# Patient Record
Sex: Female | Born: 1955
Health system: Southern US, Community
[De-identification: ages and names within clinical notes are randomized; demographics above are authoritative.]

## PROBLEM LIST (undated history)

## (undated) DIAGNOSIS — M722 Plantar fascial fibromatosis: Secondary | ICD-10-CM

## (undated) DIAGNOSIS — M1990 Unspecified osteoarthritis, unspecified site: Secondary | ICD-10-CM

## (undated) DIAGNOSIS — K219 Gastro-esophageal reflux disease without esophagitis: Secondary | ICD-10-CM

## (undated) DIAGNOSIS — F320 Major depressive disorder, single episode, mild: Secondary | ICD-10-CM

## (undated) DIAGNOSIS — F32A Depression, unspecified: Secondary | ICD-10-CM

## (undated) DIAGNOSIS — M779 Enthesopathy, unspecified: Secondary | ICD-10-CM

## (undated) DIAGNOSIS — I10 Essential (primary) hypertension: Secondary | ICD-10-CM

## (undated) DIAGNOSIS — N840 Polyp of corpus uteri: Secondary | ICD-10-CM

## (undated) DIAGNOSIS — E039 Hypothyroidism, unspecified: Secondary | ICD-10-CM

## (undated) HISTORY — DX: Essential (primary) hypertension: I10

## (undated) HISTORY — DX: Hypothyroidism, unspecified: E03.9

## (undated) HISTORY — PX: ENDOMETRIAL BIOPSY: SHX622

## (undated) HISTORY — DX: Polyp of corpus uteri: N84.0

## (undated) HISTORY — DX: Plantar fascial fibromatosis: M72.2

## (undated) HISTORY — DX: Depression, unspecified: F32.A

## (undated) HISTORY — DX: Enthesopathy, unspecified: M77.9

## (undated) HISTORY — DX: Unspecified osteoarthritis, unspecified site: M19.90

## (undated) HISTORY — PX: HYSTEROSCOPY: SHX211

## (undated) HISTORY — PX: WISDOM TOOTH EXTRACTION: SHX21

## (undated) HISTORY — PX: TUBAL LIGATION: SHX77

## (undated) HISTORY — DX: Major depressive disorder, single episode, mild: F32.0

---

## 2005-06-24 ENCOUNTER — Ambulatory Visit: Payer: Self-pay | Admitting: Obstetrics and Gynecology

## 2006-10-14 ENCOUNTER — Ambulatory Visit: Payer: Self-pay | Admitting: Allergy and Immunology

## 2006-11-18 ENCOUNTER — Other Ambulatory Visit: Payer: Self-pay

## 2006-11-18 ENCOUNTER — Ambulatory Visit: Payer: Self-pay | Admitting: Unknown Physician Specialty

## 2006-11-26 ENCOUNTER — Ambulatory Visit: Payer: Self-pay | Admitting: Unknown Physician Specialty

## 2007-03-31 ENCOUNTER — Ambulatory Visit: Payer: Self-pay | Admitting: Unknown Physician Specialty

## 2007-04-28 ENCOUNTER — Ambulatory Visit: Payer: Self-pay | Admitting: Unknown Physician Specialty

## 2008-05-11 ENCOUNTER — Ambulatory Visit: Payer: Self-pay | Admitting: Unknown Physician Specialty

## 2010-04-23 ENCOUNTER — Ambulatory Visit: Payer: Self-pay | Admitting: Unknown Physician Specialty

## 2010-06-05 ENCOUNTER — Ambulatory Visit: Payer: Self-pay | Admitting: Unknown Physician Specialty

## 2010-10-14 ENCOUNTER — Encounter: Payer: Self-pay | Admitting: Surgery

## 2010-10-23 ENCOUNTER — Encounter: Payer: Self-pay | Admitting: Surgery

## 2010-11-21 ENCOUNTER — Encounter: Payer: Self-pay | Admitting: Surgery

## 2011-08-06 ENCOUNTER — Ambulatory Visit: Payer: Self-pay | Admitting: Unknown Physician Specialty

## 2011-08-27 ENCOUNTER — Ambulatory Visit: Payer: Self-pay | Admitting: Unknown Physician Specialty

## 2012-10-13 ENCOUNTER — Ambulatory Visit: Payer: Self-pay | Admitting: General Surgery

## 2012-11-16 ENCOUNTER — Ambulatory Visit: Payer: Self-pay | Admitting: Gastroenterology

## 2013-01-17 ENCOUNTER — Ambulatory Visit: Payer: Self-pay | Admitting: Family

## 2013-01-20 ENCOUNTER — Ambulatory Visit: Payer: Self-pay | Admitting: Family

## 2013-02-20 ENCOUNTER — Ambulatory Visit: Payer: Self-pay | Admitting: Family

## 2013-03-22 ENCOUNTER — Ambulatory Visit: Payer: Self-pay | Admitting: Family

## 2013-04-22 ENCOUNTER — Ambulatory Visit: Payer: Self-pay | Admitting: Family

## 2014-04-11 ENCOUNTER — Ambulatory Visit (INDEPENDENT_AMBULATORY_CARE_PROVIDER_SITE_OTHER): Payer: BC Managed Care – PPO

## 2014-04-11 ENCOUNTER — Ambulatory Visit (INDEPENDENT_AMBULATORY_CARE_PROVIDER_SITE_OTHER): Payer: BC Managed Care – PPO | Admitting: Podiatry

## 2014-04-11 ENCOUNTER — Encounter: Payer: Self-pay | Admitting: Podiatry

## 2014-04-11 VITALS — Resp 16 | Ht 66.0 in | Wt 260.0 lb

## 2014-04-11 DIAGNOSIS — M779 Enthesopathy, unspecified: Secondary | ICD-10-CM

## 2014-04-11 MED ORDER — TRIAMCINOLONE ACETONIDE 10 MG/ML IJ SUSP
10.0000 mg | Freq: Once | INTRAMUSCULAR | Status: AC
  Administered 2014-04-11: 10 mg

## 2014-04-13 NOTE — Progress Notes (Signed)
Subjective:     Patient ID: Brittany Rocha, female   DOB: 04/01/1956, 58 y.o.   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   Review of Systems     Objective:   Physical Exam Neurovascular status intact with no other changes in health history and significant discomfort in the sinus tarsi of both feet with inflammation and fluid noted around the sinus tarsi    Assessment:     Chronic capsulitis chronic sinus tarsitis secondary to severe foot structure with reduction of arch height noted    Plan:     Reviewed condition and did sinus tarsi injections bilateral 3 mg Kenalog 5 of vesica Marcaine mixture and scanned for any type of orthotic but she can wear at different types of shoes and she's not able to wear the heel once appropriately when I need her to wear at them. Reappoint when they are ready

## 2014-04-24 ENCOUNTER — Encounter: Payer: Self-pay | Admitting: *Deleted

## 2014-04-24 ENCOUNTER — Telehealth: Payer: Self-pay | Admitting: *Deleted

## 2014-04-24 NOTE — Telephone Encounter (Signed)
Left message for pt to call and schedule appt to pick up orthotics.

## 2014-04-24 NOTE — Progress Notes (Signed)
Sent pt post card letting her know orthotics are here. 

## 2014-04-28 ENCOUNTER — Ambulatory Visit (INDEPENDENT_AMBULATORY_CARE_PROVIDER_SITE_OTHER): Payer: BC Managed Care – PPO | Admitting: *Deleted

## 2014-04-28 VITALS — BP 119/75 | HR 83 | Resp 16

## 2014-04-28 DIAGNOSIS — M779 Enthesopathy, unspecified: Secondary | ICD-10-CM

## 2014-04-28 NOTE — Patient Instructions (Signed)

## 2014-04-28 NOTE — Progress Notes (Signed)
Pt presents to pick up orthotics and went over wearing instructions with pt. 

## 2016-08-08 ENCOUNTER — Other Ambulatory Visit: Payer: Self-pay | Admitting: Obstetrics & Gynecology

## 2016-08-08 DIAGNOSIS — Z1231 Encounter for screening mammogram for malignant neoplasm of breast: Secondary | ICD-10-CM

## 2016-08-22 LAB — HM PAP SMEAR: HM PAP: NEGATIVE

## 2016-09-18 ENCOUNTER — Encounter: Payer: Self-pay | Admitting: Radiology

## 2016-09-18 ENCOUNTER — Ambulatory Visit
Admission: RE | Admit: 2016-09-18 | Discharge: 2016-09-18 | Disposition: A | Discharge status: Home / Self Care | Payer: BLUE CROSS/BLUE SHIELD | Source: Ambulatory Visit | Attending: Obstetrics & Gynecology | Admitting: Obstetrics & Gynecology

## 2016-09-18 DIAGNOSIS — Z1231 Encounter for screening mammogram for malignant neoplasm of breast: Secondary | ICD-10-CM | POA: Diagnosis present

## 2016-09-18 LAB — HM MAMMOGRAPHY

## 2016-09-19 ENCOUNTER — Ambulatory Visit: Payer: Self-pay

## 2016-09-29 ENCOUNTER — Other Ambulatory Visit: Payer: Self-pay | Admitting: *Deleted

## 2016-09-29 ENCOUNTER — Inpatient Hospital Stay
Admission: RE | Admit: 2016-09-29 | Discharge: 2016-09-29 | Disposition: A | Discharge status: Home / Self Care | Payer: Self-pay | Source: Ambulatory Visit | Attending: *Deleted | Admitting: *Deleted

## 2016-09-29 DIAGNOSIS — Z9289 Personal history of other medical treatment: Secondary | ICD-10-CM

## 2016-10-24 DIAGNOSIS — R7303 Prediabetes: Secondary | ICD-10-CM | POA: Insufficient documentation

## 2016-10-24 DIAGNOSIS — I10 Essential (primary) hypertension: Secondary | ICD-10-CM | POA: Diagnosis not present

## 2016-10-24 DIAGNOSIS — Z0000 Encounter for general adult medical examination without abnormal findings: Secondary | ICD-10-CM | POA: Diagnosis not present

## 2016-10-24 DIAGNOSIS — E039 Hypothyroidism, unspecified: Secondary | ICD-10-CM | POA: Diagnosis not present

## 2016-11-25 DIAGNOSIS — J3081 Allergic rhinitis due to animal (cat) (dog) hair and dander: Secondary | ICD-10-CM | POA: Diagnosis not present

## 2016-11-25 DIAGNOSIS — J3089 Other allergic rhinitis: Secondary | ICD-10-CM | POA: Diagnosis not present

## 2016-11-25 DIAGNOSIS — J301 Allergic rhinitis due to pollen: Secondary | ICD-10-CM | POA: Diagnosis not present

## 2016-11-27 DIAGNOSIS — J301 Allergic rhinitis due to pollen: Secondary | ICD-10-CM | POA: Diagnosis not present

## 2016-11-27 DIAGNOSIS — J3089 Other allergic rhinitis: Secondary | ICD-10-CM | POA: Diagnosis not present

## 2016-11-27 DIAGNOSIS — J3081 Allergic rhinitis due to animal (cat) (dog) hair and dander: Secondary | ICD-10-CM | POA: Diagnosis not present

## 2016-12-02 DIAGNOSIS — J3089 Other allergic rhinitis: Secondary | ICD-10-CM | POA: Diagnosis not present

## 2016-12-02 DIAGNOSIS — J301 Allergic rhinitis due to pollen: Secondary | ICD-10-CM | POA: Diagnosis not present

## 2016-12-02 DIAGNOSIS — J3081 Allergic rhinitis due to animal (cat) (dog) hair and dander: Secondary | ICD-10-CM | POA: Diagnosis not present

## 2016-12-11 DIAGNOSIS — J3089 Other allergic rhinitis: Secondary | ICD-10-CM | POA: Diagnosis not present

## 2016-12-11 DIAGNOSIS — J3081 Allergic rhinitis due to animal (cat) (dog) hair and dander: Secondary | ICD-10-CM | POA: Diagnosis not present

## 2016-12-11 DIAGNOSIS — J301 Allergic rhinitis due to pollen: Secondary | ICD-10-CM | POA: Diagnosis not present

## 2016-12-25 DIAGNOSIS — J301 Allergic rhinitis due to pollen: Secondary | ICD-10-CM | POA: Diagnosis not present

## 2016-12-25 DIAGNOSIS — J3081 Allergic rhinitis due to animal (cat) (dog) hair and dander: Secondary | ICD-10-CM | POA: Diagnosis not present

## 2016-12-25 DIAGNOSIS — J3089 Other allergic rhinitis: Secondary | ICD-10-CM | POA: Diagnosis not present

## 2016-12-30 DIAGNOSIS — J301 Allergic rhinitis due to pollen: Secondary | ICD-10-CM | POA: Diagnosis not present

## 2016-12-30 DIAGNOSIS — J3081 Allergic rhinitis due to animal (cat) (dog) hair and dander: Secondary | ICD-10-CM | POA: Diagnosis not present

## 2016-12-30 DIAGNOSIS — J3089 Other allergic rhinitis: Secondary | ICD-10-CM | POA: Diagnosis not present

## 2017-01-06 DIAGNOSIS — J3089 Other allergic rhinitis: Secondary | ICD-10-CM | POA: Diagnosis not present

## 2017-01-06 DIAGNOSIS — J3081 Allergic rhinitis due to animal (cat) (dog) hair and dander: Secondary | ICD-10-CM | POA: Diagnosis not present

## 2017-01-06 DIAGNOSIS — J301 Allergic rhinitis due to pollen: Secondary | ICD-10-CM | POA: Diagnosis not present

## 2017-01-16 DIAGNOSIS — J301 Allergic rhinitis due to pollen: Secondary | ICD-10-CM | POA: Diagnosis not present

## 2017-01-16 DIAGNOSIS — J3081 Allergic rhinitis due to animal (cat) (dog) hair and dander: Secondary | ICD-10-CM | POA: Diagnosis not present

## 2017-01-16 DIAGNOSIS — J3089 Other allergic rhinitis: Secondary | ICD-10-CM | POA: Diagnosis not present

## 2017-01-23 DIAGNOSIS — J3089 Other allergic rhinitis: Secondary | ICD-10-CM | POA: Diagnosis not present

## 2017-01-23 DIAGNOSIS — J301 Allergic rhinitis due to pollen: Secondary | ICD-10-CM | POA: Diagnosis not present

## 2017-01-29 DIAGNOSIS — R7303 Prediabetes: Secondary | ICD-10-CM | POA: Diagnosis not present

## 2017-01-29 DIAGNOSIS — I10 Essential (primary) hypertension: Secondary | ICD-10-CM | POA: Diagnosis not present

## 2017-01-29 DIAGNOSIS — J019 Acute sinusitis, unspecified: Secondary | ICD-10-CM | POA: Diagnosis not present

## 2017-02-17 DIAGNOSIS — R7303 Prediabetes: Secondary | ICD-10-CM | POA: Diagnosis not present

## 2017-02-17 DIAGNOSIS — I10 Essential (primary) hypertension: Secondary | ICD-10-CM | POA: Diagnosis not present

## 2017-02-17 DIAGNOSIS — E039 Hypothyroidism, unspecified: Secondary | ICD-10-CM | POA: Diagnosis not present

## 2017-02-23 DIAGNOSIS — R7303 Prediabetes: Secondary | ICD-10-CM | POA: Diagnosis not present

## 2017-02-23 DIAGNOSIS — I10 Essential (primary) hypertension: Secondary | ICD-10-CM | POA: Diagnosis not present

## 2017-02-23 DIAGNOSIS — E039 Hypothyroidism, unspecified: Secondary | ICD-10-CM | POA: Diagnosis not present

## 2017-03-03 DIAGNOSIS — J3089 Other allergic rhinitis: Secondary | ICD-10-CM | POA: Diagnosis not present

## 2017-03-03 DIAGNOSIS — J301 Allergic rhinitis due to pollen: Secondary | ICD-10-CM | POA: Diagnosis not present

## 2017-03-03 DIAGNOSIS — J3081 Allergic rhinitis due to animal (cat) (dog) hair and dander: Secondary | ICD-10-CM | POA: Diagnosis not present

## 2017-03-04 DIAGNOSIS — H524 Presbyopia: Secondary | ICD-10-CM | POA: Diagnosis not present

## 2017-03-13 DIAGNOSIS — J301 Allergic rhinitis due to pollen: Secondary | ICD-10-CM | POA: Diagnosis not present

## 2017-03-13 DIAGNOSIS — J3089 Other allergic rhinitis: Secondary | ICD-10-CM | POA: Diagnosis not present

## 2017-03-13 DIAGNOSIS — J3081 Allergic rhinitis due to animal (cat) (dog) hair and dander: Secondary | ICD-10-CM | POA: Diagnosis not present

## 2017-03-17 DIAGNOSIS — H539 Unspecified visual disturbance: Secondary | ICD-10-CM | POA: Diagnosis not present

## 2017-03-17 DIAGNOSIS — R42 Dizziness and giddiness: Secondary | ICD-10-CM | POA: Diagnosis not present

## 2017-03-18 ENCOUNTER — Other Ambulatory Visit: Payer: Self-pay | Admitting: Family Medicine

## 2017-03-18 DIAGNOSIS — H539 Unspecified visual disturbance: Secondary | ICD-10-CM

## 2017-03-18 DIAGNOSIS — R42 Dizziness and giddiness: Secondary | ICD-10-CM

## 2017-03-19 DIAGNOSIS — J3089 Other allergic rhinitis: Secondary | ICD-10-CM | POA: Diagnosis not present

## 2017-03-19 DIAGNOSIS — J3081 Allergic rhinitis due to animal (cat) (dog) hair and dander: Secondary | ICD-10-CM | POA: Diagnosis not present

## 2017-03-19 DIAGNOSIS — J301 Allergic rhinitis due to pollen: Secondary | ICD-10-CM | POA: Diagnosis not present

## 2017-03-24 ENCOUNTER — Ambulatory Visit
Admission: RE | Admit: 2017-03-24 | Discharge: 2017-03-24 | Disposition: A | Discharge status: Home / Self Care | Payer: BLUE CROSS/BLUE SHIELD | Source: Ambulatory Visit | Attending: Family Medicine | Admitting: Family Medicine

## 2017-03-24 DIAGNOSIS — R42 Dizziness and giddiness: Secondary | ICD-10-CM

## 2017-03-24 DIAGNOSIS — H539 Unspecified visual disturbance: Secondary | ICD-10-CM | POA: Diagnosis not present

## 2017-03-31 DIAGNOSIS — J3089 Other allergic rhinitis: Secondary | ICD-10-CM | POA: Diagnosis not present

## 2017-03-31 DIAGNOSIS — J301 Allergic rhinitis due to pollen: Secondary | ICD-10-CM | POA: Diagnosis not present

## 2017-03-31 DIAGNOSIS — J3081 Allergic rhinitis due to animal (cat) (dog) hair and dander: Secondary | ICD-10-CM | POA: Diagnosis not present

## 2017-04-07 DIAGNOSIS — J3089 Other allergic rhinitis: Secondary | ICD-10-CM | POA: Diagnosis not present

## 2017-04-07 DIAGNOSIS — J3081 Allergic rhinitis due to animal (cat) (dog) hair and dander: Secondary | ICD-10-CM | POA: Diagnosis not present

## 2017-04-07 DIAGNOSIS — J301 Allergic rhinitis due to pollen: Secondary | ICD-10-CM | POA: Diagnosis not present

## 2017-04-14 DIAGNOSIS — J3081 Allergic rhinitis due to animal (cat) (dog) hair and dander: Secondary | ICD-10-CM | POA: Diagnosis not present

## 2017-04-14 DIAGNOSIS — J3089 Other allergic rhinitis: Secondary | ICD-10-CM | POA: Diagnosis not present

## 2017-04-14 DIAGNOSIS — J301 Allergic rhinitis due to pollen: Secondary | ICD-10-CM | POA: Diagnosis not present

## 2017-04-24 DIAGNOSIS — J301 Allergic rhinitis due to pollen: Secondary | ICD-10-CM | POA: Diagnosis not present

## 2017-04-24 DIAGNOSIS — J3089 Other allergic rhinitis: Secondary | ICD-10-CM | POA: Diagnosis not present

## 2017-04-24 DIAGNOSIS — J3081 Allergic rhinitis due to animal (cat) (dog) hair and dander: Secondary | ICD-10-CM | POA: Diagnosis not present

## 2017-05-01 DIAGNOSIS — J3089 Other allergic rhinitis: Secondary | ICD-10-CM | POA: Diagnosis not present

## 2017-05-01 DIAGNOSIS — J3081 Allergic rhinitis due to animal (cat) (dog) hair and dander: Secondary | ICD-10-CM | POA: Diagnosis not present

## 2017-05-01 DIAGNOSIS — J301 Allergic rhinitis due to pollen: Secondary | ICD-10-CM | POA: Diagnosis not present

## 2017-05-05 ENCOUNTER — Ambulatory Visit (INDEPENDENT_AMBULATORY_CARE_PROVIDER_SITE_OTHER): Payer: BLUE CROSS/BLUE SHIELD

## 2017-05-05 ENCOUNTER — Ambulatory Visit (INDEPENDENT_AMBULATORY_CARE_PROVIDER_SITE_OTHER): Payer: BLUE CROSS/BLUE SHIELD | Admitting: Podiatry

## 2017-05-05 DIAGNOSIS — M659 Synovitis and tenosynovitis, unspecified: Secondary | ICD-10-CM | POA: Diagnosis not present

## 2017-05-05 DIAGNOSIS — E039 Hypothyroidism, unspecified: Secondary | ICD-10-CM | POA: Insufficient documentation

## 2017-05-05 DIAGNOSIS — M79673 Pain in unspecified foot: Secondary | ICD-10-CM | POA: Diagnosis not present

## 2017-05-05 DIAGNOSIS — M25572 Pain in left ankle and joints of left foot: Secondary | ICD-10-CM | POA: Diagnosis not present

## 2017-05-05 DIAGNOSIS — M25571 Pain in right ankle and joints of right foot: Secondary | ICD-10-CM | POA: Diagnosis not present

## 2017-05-05 DIAGNOSIS — K219 Gastro-esophageal reflux disease without esophagitis: Secondary | ICD-10-CM | POA: Insufficient documentation

## 2017-05-05 DIAGNOSIS — J3089 Other allergic rhinitis: Secondary | ICD-10-CM | POA: Diagnosis not present

## 2017-05-05 DIAGNOSIS — J3081 Allergic rhinitis due to animal (cat) (dog) hair and dander: Secondary | ICD-10-CM | POA: Diagnosis not present

## 2017-05-05 DIAGNOSIS — I10 Essential (primary) hypertension: Secondary | ICD-10-CM | POA: Insufficient documentation

## 2017-05-05 DIAGNOSIS — J301 Allergic rhinitis due to pollen: Secondary | ICD-10-CM | POA: Diagnosis not present

## 2017-05-05 DIAGNOSIS — M7751 Other enthesopathy of right foot: Secondary | ICD-10-CM

## 2017-05-07 DIAGNOSIS — J3089 Other allergic rhinitis: Secondary | ICD-10-CM | POA: Diagnosis not present

## 2017-05-07 DIAGNOSIS — J301 Allergic rhinitis due to pollen: Secondary | ICD-10-CM | POA: Diagnosis not present

## 2017-05-07 DIAGNOSIS — J3081 Allergic rhinitis due to animal (cat) (dog) hair and dander: Secondary | ICD-10-CM | POA: Diagnosis not present

## 2017-05-11 MED ORDER — BETAMETHASONE SOD PHOS & ACET 6 (3-3) MG/ML IJ SUSP: 3.0000 mg | Freq: Once | INTRAMUSCULAR | Status: AC

## 2017-05-11 NOTE — Progress Notes (Signed)
   Subjective:  Patient presents today for pain and tenderness to the bilateral ankles. Patient relates significant pain and tenderness when walking.  Patient states that the pain is on the lateral aspects of her ankles bilaterally for the past 3 months. Gradual onset. Patient denies any trauma. Patient also states that she has right big toe joint pain. She presents today for further treatment and evaluation   Objective / Physical Exam:  General:  The patient is alert and oriented x3 in no acute distress. Dermatology:  Skin is warm, dry and supple bilateral lower extremities. Negative for open lesions or macerations. Vascular:  Palpable pedal pulses bilaterally. No edema or erythema noted. Capillary refill within normal limits. Neurological:  Epicritic and protective threshold grossly intact bilaterally.  Musculoskeletal Exam:  Pain on palpation to the anterior lateral medial aspects of the patient's bilateral ankles. Mild edema noted. There is also pain on palpation with and range of motion to the first MTPJ right foot Range of motion within normal limits to all pedal and ankle joints bilateral. Muscle strength 5/5 in all groups bilateral.   Radiographic Exam:  Normal osseous mineralization. Joint spaces preserved. No fracture/dislocation/boney destruction.    Assessment: #1 pain in bilateral ankles with synovitis #2 metatarsophalangeal capsulitis first MTPJ right foot  Plan of Care:  #1 Patient was evaluated. #2 injection of 0.5 mL Celestone Soluspan injected in the patient's bilateral ankle joints. #3 injection of 0.5 mL Celestone Soluspan injected in the first MTPJ right foot #4 ankle braces were dispensed for the bilateral lower extremities to support ankle joints #5 today were going to arrange an appointment with Raiford Noble, Pedorthist for custom molded insoles #6 continue over-the-counter Motrin when necessary #7 return to clinic in 4 weeks   Felecia Shelling, DPM [facility]  Dr. Felecia Shelling, DPM    [address]                                        [place], [place] [address]                Office [phone]  Fax [phone]

## 2017-05-12 DIAGNOSIS — J3081 Allergic rhinitis due to animal (cat) (dog) hair and dander: Secondary | ICD-10-CM | POA: Diagnosis not present

## 2017-05-12 DIAGNOSIS — J301 Allergic rhinitis due to pollen: Secondary | ICD-10-CM | POA: Diagnosis not present

## 2017-05-12 DIAGNOSIS — J3089 Other allergic rhinitis: Secondary | ICD-10-CM | POA: Diagnosis not present

## 2017-05-15 DIAGNOSIS — J3081 Allergic rhinitis due to animal (cat) (dog) hair and dander: Secondary | ICD-10-CM | POA: Diagnosis not present

## 2017-05-15 DIAGNOSIS — J301 Allergic rhinitis due to pollen: Secondary | ICD-10-CM | POA: Diagnosis not present

## 2017-05-19 DIAGNOSIS — J301 Allergic rhinitis due to pollen: Secondary | ICD-10-CM | POA: Diagnosis not present

## 2017-05-19 DIAGNOSIS — J3081 Allergic rhinitis due to animal (cat) (dog) hair and dander: Secondary | ICD-10-CM | POA: Diagnosis not present

## 2017-05-19 DIAGNOSIS — J3089 Other allergic rhinitis: Secondary | ICD-10-CM | POA: Diagnosis not present

## 2017-05-20 ENCOUNTER — Ambulatory Visit (INDEPENDENT_AMBULATORY_CARE_PROVIDER_SITE_OTHER): Payer: BLUE CROSS/BLUE SHIELD | Admitting: Orthotics

## 2017-05-20 DIAGNOSIS — M659 Synovitis and tenosynovitis, unspecified: Secondary | ICD-10-CM

## 2017-05-20 NOTE — Progress Notes (Signed)
Patient was seen today for custom foot orthotics.  Patient presents wit synovitis of left ankle, and medial column collapse.  Goal arch support, take pressure off peroneals.  Plan of deep, wide orthotic wi/ medial lateral flange.

## 2017-05-21 DIAGNOSIS — J301 Allergic rhinitis due to pollen: Secondary | ICD-10-CM | POA: Diagnosis not present

## 2017-05-21 DIAGNOSIS — J3081 Allergic rhinitis due to animal (cat) (dog) hair and dander: Secondary | ICD-10-CM | POA: Diagnosis not present

## 2017-05-29 DIAGNOSIS — J3089 Other allergic rhinitis: Secondary | ICD-10-CM | POA: Diagnosis not present

## 2017-05-29 DIAGNOSIS — J3081 Allergic rhinitis due to animal (cat) (dog) hair and dander: Secondary | ICD-10-CM | POA: Diagnosis not present

## 2017-05-29 DIAGNOSIS — J301 Allergic rhinitis due to pollen: Secondary | ICD-10-CM | POA: Diagnosis not present

## 2017-06-02 ENCOUNTER — Ambulatory Visit (INDEPENDENT_AMBULATORY_CARE_PROVIDER_SITE_OTHER): Payer: BLUE CROSS/BLUE SHIELD | Admitting: Podiatry

## 2017-06-02 DIAGNOSIS — M659 Synovitis and tenosynovitis, unspecified: Secondary | ICD-10-CM

## 2017-06-02 DIAGNOSIS — J301 Allergic rhinitis due to pollen: Secondary | ICD-10-CM | POA: Diagnosis not present

## 2017-06-02 DIAGNOSIS — J3081 Allergic rhinitis due to animal (cat) (dog) hair and dander: Secondary | ICD-10-CM | POA: Diagnosis not present

## 2017-06-02 DIAGNOSIS — J3089 Other allergic rhinitis: Secondary | ICD-10-CM | POA: Diagnosis not present

## 2017-06-05 MED ORDER — BETAMETHASONE SOD PHOS & ACET 6 (3-3) MG/ML IJ SUSP: 3.0000 mg | Freq: Once | INTRAMUSCULAR | Status: AC

## 2017-06-05 NOTE — Progress Notes (Signed)
   Subjective:  Patient presents today for follow-up evaluation of pain and tenderness to the bilateral ankles and right foot. She states the pain has improved but she is still experiencing mild pain on the lateral side of the right ankle and the left foot. She states wearing the ankle braces help to alleviate the pain but they are too big.  She also presents with a new complaint of intermittent pain of the right great toe that began about 3 years ago. She states she had the toenail removed over three years ago and is unsure if that is the cause of the pain. She presents today for further treatment and evaluation   Objective / Physical Exam:  General:  The patient is alert and oriented x3 in no acute distress. Dermatology:  Skin is warm, dry and supple bilateral lower extremities. Negative for open lesions or macerations. Vascular:  Palpable pedal pulses bilaterally. No edema or erythema noted. Capillary refill within normal limits. Neurological:  Epicritic and protective threshold grossly intact bilaterally.  Musculoskeletal Exam:  Pain on palpation to the anterior lateral medial aspects of the patient's bilateral ankles. Mild edema noted. There is also pain on palpation with and range of motion to the first MTPJ right foot Range of motion within normal limits to all pedal and ankle joints bilateral. Muscle strength 5/5 in all groups bilateral.   Assessment: #1 pain in bilateral ankles with synovitis #2 DJD/hallux limitus first MTPJ right foot  Plan of Care:  #1 Patient was evaluated. #2 injection of 0.5 mL Celestone Soluspan injected in the patient's right ankle joint. #3 Has appt with Rick on 06/17/17 to pick up orthotics.  #4 Continue taking Motrin as needed.  #5 Return to clinic when necessary.    Felecia Shelling, DPM [facility]  Dr. Felecia Shelling, DPM    [address]                                        [place], [place] [address]                Office  [phone]  Fax [phone]

## 2017-06-12 DIAGNOSIS — J3081 Allergic rhinitis due to animal (cat) (dog) hair and dander: Secondary | ICD-10-CM | POA: Diagnosis not present

## 2017-06-12 DIAGNOSIS — J301 Allergic rhinitis due to pollen: Secondary | ICD-10-CM | POA: Diagnosis not present

## 2017-06-12 DIAGNOSIS — J4540 Moderate persistent asthma, uncomplicated: Secondary | ICD-10-CM | POA: Diagnosis not present

## 2017-06-12 DIAGNOSIS — J3089 Other allergic rhinitis: Secondary | ICD-10-CM | POA: Diagnosis not present

## 2017-06-17 ENCOUNTER — Ambulatory Visit: Payer: BLUE CROSS/BLUE SHIELD | Admitting: Orthotics

## 2017-06-17 DIAGNOSIS — M659 Synovitis and tenosynovitis, unspecified: Secondary | ICD-10-CM

## 2017-06-17 NOTE — Progress Notes (Signed)
Patient came in today to pick up custom made foot orthotics.  The goals were accomplished and the patient reported no dissatisfaction with said orthotics.  Patient was advised of breakin period and how to report any issues. 

## 2017-06-25 DIAGNOSIS — J3081 Allergic rhinitis due to animal (cat) (dog) hair and dander: Secondary | ICD-10-CM | POA: Diagnosis not present

## 2017-06-25 DIAGNOSIS — J3089 Other allergic rhinitis: Secondary | ICD-10-CM | POA: Diagnosis not present

## 2017-06-25 DIAGNOSIS — J301 Allergic rhinitis due to pollen: Secondary | ICD-10-CM | POA: Diagnosis not present

## 2017-07-06 DIAGNOSIS — R0789 Other chest pain: Secondary | ICD-10-CM | POA: Diagnosis not present

## 2017-07-10 DIAGNOSIS — J3089 Other allergic rhinitis: Secondary | ICD-10-CM | POA: Diagnosis not present

## 2017-07-10 DIAGNOSIS — J3081 Allergic rhinitis due to animal (cat) (dog) hair and dander: Secondary | ICD-10-CM | POA: Diagnosis not present

## 2017-07-10 DIAGNOSIS — J301 Allergic rhinitis due to pollen: Secondary | ICD-10-CM | POA: Diagnosis not present

## 2017-07-16 DIAGNOSIS — J301 Allergic rhinitis due to pollen: Secondary | ICD-10-CM | POA: Diagnosis not present

## 2017-07-16 DIAGNOSIS — J3089 Other allergic rhinitis: Secondary | ICD-10-CM | POA: Diagnosis not present

## 2017-07-21 DIAGNOSIS — J3081 Allergic rhinitis due to animal (cat) (dog) hair and dander: Secondary | ICD-10-CM | POA: Diagnosis not present

## 2017-07-21 DIAGNOSIS — J3089 Other allergic rhinitis: Secondary | ICD-10-CM | POA: Diagnosis not present

## 2017-07-21 DIAGNOSIS — J301 Allergic rhinitis due to pollen: Secondary | ICD-10-CM | POA: Diagnosis not present

## 2017-07-23 DIAGNOSIS — J301 Allergic rhinitis due to pollen: Secondary | ICD-10-CM | POA: Diagnosis not present

## 2017-07-23 DIAGNOSIS — J3081 Allergic rhinitis due to animal (cat) (dog) hair and dander: Secondary | ICD-10-CM | POA: Diagnosis not present

## 2017-07-24 DIAGNOSIS — J3089 Other allergic rhinitis: Secondary | ICD-10-CM | POA: Diagnosis not present

## 2017-07-30 DIAGNOSIS — J3081 Allergic rhinitis due to animal (cat) (dog) hair and dander: Secondary | ICD-10-CM | POA: Diagnosis not present

## 2017-07-30 DIAGNOSIS — J3089 Other allergic rhinitis: Secondary | ICD-10-CM | POA: Diagnosis not present

## 2017-07-30 DIAGNOSIS — J301 Allergic rhinitis due to pollen: Secondary | ICD-10-CM | POA: Diagnosis not present

## 2017-08-04 DIAGNOSIS — J3089 Other allergic rhinitis: Secondary | ICD-10-CM | POA: Diagnosis not present

## 2017-08-04 DIAGNOSIS — J3081 Allergic rhinitis due to animal (cat) (dog) hair and dander: Secondary | ICD-10-CM | POA: Diagnosis not present

## 2017-08-04 DIAGNOSIS — J301 Allergic rhinitis due to pollen: Secondary | ICD-10-CM | POA: Diagnosis not present

## 2017-08-07 DIAGNOSIS — J301 Allergic rhinitis due to pollen: Secondary | ICD-10-CM | POA: Diagnosis not present

## 2017-08-07 DIAGNOSIS — J3081 Allergic rhinitis due to animal (cat) (dog) hair and dander: Secondary | ICD-10-CM | POA: Diagnosis not present

## 2017-08-07 DIAGNOSIS — J3089 Other allergic rhinitis: Secondary | ICD-10-CM | POA: Diagnosis not present

## 2017-08-11 DIAGNOSIS — J3081 Allergic rhinitis due to animal (cat) (dog) hair and dander: Secondary | ICD-10-CM | POA: Diagnosis not present

## 2017-08-11 DIAGNOSIS — J3089 Other allergic rhinitis: Secondary | ICD-10-CM | POA: Diagnosis not present

## 2017-08-11 DIAGNOSIS — J301 Allergic rhinitis due to pollen: Secondary | ICD-10-CM | POA: Diagnosis not present

## 2017-08-17 DIAGNOSIS — E039 Hypothyroidism, unspecified: Secondary | ICD-10-CM | POA: Diagnosis not present

## 2017-08-17 DIAGNOSIS — R7303 Prediabetes: Secondary | ICD-10-CM | POA: Diagnosis not present

## 2017-08-17 DIAGNOSIS — I10 Essential (primary) hypertension: Secondary | ICD-10-CM | POA: Diagnosis not present

## 2017-08-19 ENCOUNTER — Other Ambulatory Visit: Payer: Self-pay | Admitting: Obstetrics & Gynecology

## 2017-08-19 DIAGNOSIS — Z1231 Encounter for screening mammogram for malignant neoplasm of breast: Secondary | ICD-10-CM

## 2017-08-20 DIAGNOSIS — J069 Acute upper respiratory infection, unspecified: Secondary | ICD-10-CM | POA: Diagnosis not present

## 2017-08-24 DIAGNOSIS — I10 Essential (primary) hypertension: Secondary | ICD-10-CM | POA: Diagnosis not present

## 2017-08-24 DIAGNOSIS — E039 Hypothyroidism, unspecified: Secondary | ICD-10-CM | POA: Diagnosis not present

## 2017-08-24 DIAGNOSIS — R7303 Prediabetes: Secondary | ICD-10-CM | POA: Diagnosis not present

## 2017-09-03 ENCOUNTER — Encounter: Payer: Self-pay | Admitting: Obstetrics & Gynecology

## 2017-09-11 ENCOUNTER — Encounter: Payer: Self-pay | Admitting: Obstetrics & Gynecology

## 2017-09-16 ENCOUNTER — Ambulatory Visit (INDEPENDENT_AMBULATORY_CARE_PROVIDER_SITE_OTHER): Payer: BLUE CROSS/BLUE SHIELD | Admitting: Obstetrics & Gynecology

## 2017-09-16 ENCOUNTER — Encounter: Payer: Self-pay | Admitting: Obstetrics & Gynecology

## 2017-09-16 VITALS — BP 120/80 | HR 84 | Ht 66.0 in | Wt 254.0 lb

## 2017-09-16 DIAGNOSIS — Z01419 Encounter for gynecological examination (general) (routine) without abnormal findings: Secondary | ICD-10-CM | POA: Diagnosis not present

## 2017-09-16 DIAGNOSIS — Z1211 Encounter for screening for malignant neoplasm of colon: Secondary | ICD-10-CM | POA: Diagnosis not present

## 2017-09-16 DIAGNOSIS — Z0000 Encounter for general adult medical examination without abnormal findings: Secondary | ICD-10-CM

## 2017-09-16 DIAGNOSIS — Z1239 Encounter for other screening for malignant neoplasm of breast: Secondary | ICD-10-CM

## 2017-09-16 DIAGNOSIS — Z8041 Family history of malignant neoplasm of ovary: Secondary | ICD-10-CM | POA: Diagnosis not present

## 2017-09-16 DIAGNOSIS — Z1231 Encounter for screening mammogram for malignant neoplasm of breast: Secondary | ICD-10-CM | POA: Diagnosis not present

## 2017-09-16 NOTE — Patient Instructions (Addendum)
PAP every three years Mammogram every year    Call [phone] to schedule at [facility] Colonoscopy every 10 years Labs yearly (with PCP) 

## 2017-09-16 NOTE — Addendum Note (Signed)
Addended by: Nadara MustardHARRIS, Jourden Gilson P on: 09/16/2017 09:54 AM   Modules accepted: Orders

## 2017-09-16 NOTE — Addendum Note (Signed)
Addended by: Nadara MustardHARRIS, Oisin Yoakum P on: 09/16/2017 09:34 AM   Modules accepted: Orders

## 2017-09-16 NOTE — Progress Notes (Signed)
HPI:      [name] is a 61 y.o. G2P2002 who LMP was in the past, she presents today for her annual examination.  The patient has no complaints today. The patient is not currently sexually active. Herlast pap: approximate date 2017 and was normal and last mammogram: approximate date 2017 and was normal.  The patient does perform self breast exams.  There is no notable family history of breast or ovarian cancer in her family. The patient is not taking hormone replacement therapy. Patient denies post-menopausal vaginal bleeding.   The patient has regular exercise: yes. The patient denies current symptoms of depression.    No further PMB FH discussed.  GYN Hx: Last Colonoscopy:5 years ago. Normal.  Last DEXA: never ago.    PMHx: Past Medical History:  Diagnosis Date  . Arthritis   . Endometrial polyp   . Hypertension   . Hypothyroidism   . Mild depression ([facility])   . Plantar fasciitis   . Tendonitis   . Tendonitis    Past Surgical History:  Procedure Laterality Date  . ENDOMETRIAL BIOPSY    . HYSTEROSCOPY    . TUBAL LIGATION    . WISDOM TOOTH EXTRACTION     Family History  Problem Relation Age of Onset  . Cancer Mother 38       ovarian  . Hypertension Father   . Lung cancer Father   . Stomach cancer Maternal Aunt 77   Social History   Tobacco Use  . Smoking status: Never Smoker  . Smokeless tobacco: Never Used  Substance Use Topics  . Alcohol use: No  . Drug use: No    Current Outpatient Medications:  .  amLODipine (NORVASC) 2.5 MG tablet, TAKE 1 TABLET DAILY, Disp: , Rfl:  .  aspirin EC 81 MG tablet, Take by mouth., Disp: , Rfl:  .  hydrochlorothiazide (HYDRODIURIL) 25 MG tablet, Take by mouth., Disp: , Rfl:  .  levothyroxine (SYNTHROID, LEVOTHROID) 100 MCG tablet, TAKE 1 TABLET DAILY. TAKE ON AN EMPTY STOMACH WITH A GLASS OF WATER AT LEAST 30 TO 60 MINUTES BEFORE BREAKFAST, Disp: , Rfl:   Current Facility-Administered Medications:  .  betamethasone  acetate-betamethasone sodium phosphate (CELESTONE) injection 3 mg, 3 mg, Intramuscular, Once, Evans, Brent M, DPM .  betamethasone acetate-betamethasone sodium phosphate (CELESTONE) injection 3 mg, 3 mg, Intramuscular, Once, Edrick Kins, DPM Allergies: Dust mite extract; Pollen extract; and Tree extract  Review of Systems  Constitutional: Negative for chills, fever and malaise/fatigue.  HENT: Negative for congestion, sinus pain and sore throat.   Eyes: Negative for blurred vision and pain.  Respiratory: Negative for cough and wheezing.   Cardiovascular: Negative for chest pain and leg swelling.  Gastrointestinal: Negative for abdominal pain, constipation, diarrhea, heartburn, nausea and vomiting.  Genitourinary: Negative for dysuria, frequency, hematuria and urgency.  Musculoskeletal: Negative for back pain, joint pain, myalgias and neck pain.  Skin: Negative for itching and rash.  Neurological: Negative for dizziness, tremors and weakness.  Endo/Heme/Allergies: Does not bruise/bleed easily.  Psychiatric/Behavioral: Negative for depression. The patient is not nervous/anxious and does not have insomnia.     Objective: BP 120/80   Pulse 84   Ht _0  (1.676 m)   Wt 254 lb (115.2 kg)   BMI 41.00 kg/m   Filed Weights   09/16/17 0858  Weight: 254 lb (115.2 kg)   Body mass index is 41 kg/m. Physical Exam  Constitutional: She is oriented to person, place, and time. She  appears well-developed and well-nourished. No distress.  Genitourinary: Rectum normal, vagina normal and uterus normal. Pelvic exam was performed with patient supine. There is no rash or lesion on the right labia. There is no rash or lesion on the left labia. Vagina exhibits no lesion. No bleeding in the vagina. Right adnexum does not display mass and does not display tenderness. Left adnexum does not display mass and does not display tenderness. Cervix does not exhibit motion tenderness, lesion, friability or polyp.    Uterus is mobile and midaxial. Uterus is not enlarged or exhibiting a mass.  HENT:  Head: Normocephalic and atraumatic. Head is without laceration.  Right Ear: Hearing normal.  Left Ear: Hearing normal.  Nose: No epistaxis.  No foreign bodies.  Mouth/Throat: Uvula is midline, oropharynx is clear and moist and mucous membranes are normal.  Eyes: Pupils are equal, round, and reactive to light.  Neck: Normal range of motion. Neck supple. No thyromegaly present.  Cardiovascular: Normal rate and regular rhythm. Exam reveals no gallop and no friction rub.  No murmur heard. Pulmonary/Chest: Effort normal and breath sounds normal. No respiratory distress. She has no wheezes. Right breast exhibits no mass, no skin change and no tenderness. Left breast exhibits no mass, no skin change and no tenderness.  Abdominal: Soft. Bowel sounds are normal. She exhibits no distension. There is no tenderness. There is no rebound.  Musculoskeletal: Normal range of motion.  Neurological: She is alert and oriented to person, place, and time. No cranial nerve deficit.  Skin: Skin is warm and dry.  Psychiatric: She has a normal mood and affect. Judgment normal.  Vitals reviewed.  Assessment: Annual Exam 1. Annual physical exam   2. Screening for breast cancer   3. Screen for colon cancer    Plan:            1.  Cervical Screening-  Pap smear schedule reviewed with patient  2. Breast screening- Exam annually and mammogram scheduled (MMG sch for tomorrow)  3. Colonoscopy every 10 years, Hemoccult testing after age 71  4. Labs managed by PCP  5. Counseling for hormonal therapy: none, no change in therapy today   6.  She presents with a significant personal and/or family history of ovarian cancer mother onset age 35. Details of which can be found in her medical/family history. She does not have a previously identified BRCA and Lynch syndrome mutation in her family. Due to her personal and/or family history  of cancer she is a candidate for the [facility] test(s).    Risk for cancer, genetic susceptibility discussed.  Patient has requested gene testing.  Discussed BRCA as well as Lynch syndrome and other cancer risk assessments available based on her family history and personal history. Pros and cons of testing discussed.  This represents additional counseling and discussions (15 minutes face to face time).    F/U  Return in about 1 year (around 09/16/2018) for Annual.  Barnett Applebaum, MD, [facility], [facility] 09/16/2017  9:03 AM

## 2017-09-21 ENCOUNTER — Ambulatory Visit
Admission: RE | Admit: 2017-09-21 | Discharge: 2017-09-21 | Disposition: A | Discharge status: Home / Self Care | Payer: BLUE CROSS/BLUE SHIELD | Source: Ambulatory Visit | Attending: Obstetrics & Gynecology | Admitting: Obstetrics & Gynecology

## 2017-09-21 ENCOUNTER — Encounter: Payer: Self-pay | Admitting: Obstetrics & Gynecology

## 2017-09-21 DIAGNOSIS — Z1231 Encounter for screening mammogram for malignant neoplasm of breast: Secondary | ICD-10-CM | POA: Diagnosis not present

## 2017-09-29 DIAGNOSIS — J301 Allergic rhinitis due to pollen: Secondary | ICD-10-CM | POA: Diagnosis not present

## 2017-09-29 DIAGNOSIS — J3089 Other allergic rhinitis: Secondary | ICD-10-CM | POA: Diagnosis not present

## 2017-09-29 DIAGNOSIS — J3081 Allergic rhinitis due to animal (cat) (dog) hair and dander: Secondary | ICD-10-CM | POA: Diagnosis not present

## 2017-10-02 DIAGNOSIS — J3089 Other allergic rhinitis: Secondary | ICD-10-CM | POA: Diagnosis not present

## 2017-10-02 DIAGNOSIS — J3081 Allergic rhinitis due to animal (cat) (dog) hair and dander: Secondary | ICD-10-CM | POA: Diagnosis not present

## 2017-10-02 DIAGNOSIS — J301 Allergic rhinitis due to pollen: Secondary | ICD-10-CM | POA: Diagnosis not present

## 2017-10-06 DIAGNOSIS — J3081 Allergic rhinitis due to animal (cat) (dog) hair and dander: Secondary | ICD-10-CM | POA: Diagnosis not present

## 2017-10-06 DIAGNOSIS — J3089 Other allergic rhinitis: Secondary | ICD-10-CM | POA: Diagnosis not present

## 2017-10-06 DIAGNOSIS — J301 Allergic rhinitis due to pollen: Secondary | ICD-10-CM | POA: Diagnosis not present

## 2017-10-08 DIAGNOSIS — J3089 Other allergic rhinitis: Secondary | ICD-10-CM | POA: Diagnosis not present

## 2017-10-08 DIAGNOSIS — J301 Allergic rhinitis due to pollen: Secondary | ICD-10-CM | POA: Diagnosis not present

## 2017-10-13 DIAGNOSIS — J3081 Allergic rhinitis due to animal (cat) (dog) hair and dander: Secondary | ICD-10-CM | POA: Diagnosis not present

## 2017-10-13 DIAGNOSIS — J301 Allergic rhinitis due to pollen: Secondary | ICD-10-CM | POA: Diagnosis not present

## 2017-10-13 DIAGNOSIS — J3089 Other allergic rhinitis: Secondary | ICD-10-CM | POA: Diagnosis not present

## 2017-10-22 DIAGNOSIS — J301 Allergic rhinitis due to pollen: Secondary | ICD-10-CM | POA: Diagnosis not present

## 2017-10-22 DIAGNOSIS — J3081 Allergic rhinitis due to animal (cat) (dog) hair and dander: Secondary | ICD-10-CM | POA: Diagnosis not present

## 2017-10-22 DIAGNOSIS — J3089 Other allergic rhinitis: Secondary | ICD-10-CM | POA: Diagnosis not present

## 2017-10-27 DIAGNOSIS — J3081 Allergic rhinitis due to animal (cat) (dog) hair and dander: Secondary | ICD-10-CM | POA: Diagnosis not present

## 2017-10-27 DIAGNOSIS — J3089 Other allergic rhinitis: Secondary | ICD-10-CM | POA: Diagnosis not present

## 2017-10-27 DIAGNOSIS — J301 Allergic rhinitis due to pollen: Secondary | ICD-10-CM | POA: Diagnosis not present

## 2017-11-05 DIAGNOSIS — J3081 Allergic rhinitis due to animal (cat) (dog) hair and dander: Secondary | ICD-10-CM | POA: Diagnosis not present

## 2017-11-05 DIAGNOSIS — J3089 Other allergic rhinitis: Secondary | ICD-10-CM | POA: Diagnosis not present

## 2017-11-05 DIAGNOSIS — J301 Allergic rhinitis due to pollen: Secondary | ICD-10-CM | POA: Diagnosis not present

## 2017-11-13 DIAGNOSIS — J301 Allergic rhinitis due to pollen: Secondary | ICD-10-CM | POA: Diagnosis not present

## 2017-11-13 DIAGNOSIS — J3089 Other allergic rhinitis: Secondary | ICD-10-CM | POA: Diagnosis not present

## 2017-11-13 DIAGNOSIS — J3081 Allergic rhinitis due to animal (cat) (dog) hair and dander: Secondary | ICD-10-CM | POA: Diagnosis not present

## 2017-11-17 DIAGNOSIS — J3081 Allergic rhinitis due to animal (cat) (dog) hair and dander: Secondary | ICD-10-CM | POA: Diagnosis not present

## 2017-11-17 DIAGNOSIS — J3089 Other allergic rhinitis: Secondary | ICD-10-CM | POA: Diagnosis not present

## 2017-11-17 DIAGNOSIS — J301 Allergic rhinitis due to pollen: Secondary | ICD-10-CM | POA: Diagnosis not present

## 2017-11-27 DIAGNOSIS — J3081 Allergic rhinitis due to animal (cat) (dog) hair and dander: Secondary | ICD-10-CM | POA: Diagnosis not present

## 2017-11-27 DIAGNOSIS — J3089 Other allergic rhinitis: Secondary | ICD-10-CM | POA: Diagnosis not present

## 2017-11-27 DIAGNOSIS — J301 Allergic rhinitis due to pollen: Secondary | ICD-10-CM | POA: Diagnosis not present

## 2017-12-03 DIAGNOSIS — J3081 Allergic rhinitis due to animal (cat) (dog) hair and dander: Secondary | ICD-10-CM | POA: Diagnosis not present

## 2017-12-03 DIAGNOSIS — J301 Allergic rhinitis due to pollen: Secondary | ICD-10-CM | POA: Diagnosis not present

## 2017-12-03 DIAGNOSIS — J3089 Other allergic rhinitis: Secondary | ICD-10-CM | POA: Diagnosis not present

## 2017-12-17 DIAGNOSIS — I10 Essential (primary) hypertension: Secondary | ICD-10-CM | POA: Diagnosis not present

## 2017-12-17 DIAGNOSIS — R7303 Prediabetes: Secondary | ICD-10-CM | POA: Diagnosis not present

## 2017-12-18 ENCOUNTER — Ambulatory Visit: Payer: BLUE CROSS/BLUE SHIELD | Admitting: Podiatry

## 2017-12-18 ENCOUNTER — Encounter: Payer: Self-pay | Admitting: Podiatry

## 2017-12-18 DIAGNOSIS — M659 Synovitis and tenosynovitis, unspecified: Secondary | ICD-10-CM | POA: Diagnosis not present

## 2017-12-18 DIAGNOSIS — J3089 Other allergic rhinitis: Secondary | ICD-10-CM | POA: Diagnosis not present

## 2017-12-18 DIAGNOSIS — J3081 Allergic rhinitis due to animal (cat) (dog) hair and dander: Secondary | ICD-10-CM | POA: Diagnosis not present

## 2017-12-18 DIAGNOSIS — J301 Allergic rhinitis due to pollen: Secondary | ICD-10-CM | POA: Diagnosis not present

## 2017-12-23 NOTE — Progress Notes (Signed)
   Subjective:  62 year old female presenting today for follow up evaluation of right ankle pain. She reports continued constant pain that began worsening last week. She describes the pain as sharp and shooting when walking and standing. She has been wearing the ankle brace which helps provided some relief. Patient is here for further evaluation and treatment.   Past Medical History:  Diagnosis Date  . Arthritis   . Endometrial polyp   . Hypertension   . Hypothyroidism   . Mild depression ([facility])   . Plantar fasciitis   . Tendonitis   . Tendonitis       Objective / Physical Exam:  General:  The patient is alert and oriented x3 in no acute distress. Dermatology:  Skin is warm, dry and supple bilateral lower extremities. Negative for open lesions or macerations. Vascular:  Palpable pedal pulses bilaterally. No edema or erythema noted. Capillary refill within normal limits. Neurological:  Epicritic and protective threshold grossly intact bilaterally.  Musculoskeletal Exam:  Pain on palpation to the anterior lateral medial aspects of the patient's right ankle. Mild edema noted. Range of motion within normal limits to all pedal and ankle joints bilateral. Muscle strength 5/5 in all groups bilateral.   Assessment: #1 pain in right ankle #2 synovitis of right ankle #3 Neuritis digits 1-5 bilateral secondary to shoe gear  Plan of Care:  #1 Patient was evaluated. #2 injection of 0.5 mL Celestone Soluspan injected in the patient's right ankle. #3 Continue wearing the ankle brace, custom molded orthotics and New Balance shoes.  #4 Return to clinic as needed.   Felecia ShellingBrent M. Phylicia Mcgaugh, DPM [facility]  Dr. Felecia ShellingBrent M. Destine Ambroise, DPM    [address]                                        [place], [place] [address]                Office [phone]  Fax [phone]

## 2017-12-24 DIAGNOSIS — I10 Essential (primary) hypertension: Secondary | ICD-10-CM | POA: Diagnosis not present

## 2017-12-24 DIAGNOSIS — E039 Hypothyroidism, unspecified: Secondary | ICD-10-CM | POA: Diagnosis not present

## 2017-12-24 DIAGNOSIS — R7303 Prediabetes: Secondary | ICD-10-CM | POA: Diagnosis not present

## 2017-12-24 DIAGNOSIS — Z7185 Encounter for immunization safety counseling: Secondary | ICD-10-CM | POA: Insufficient documentation

## 2017-12-24 DIAGNOSIS — Z0000 Encounter for general adult medical examination without abnormal findings: Secondary | ICD-10-CM | POA: Diagnosis not present

## 2017-12-24 DIAGNOSIS — Z7189 Other specified counseling: Secondary | ICD-10-CM | POA: Insufficient documentation

## 2018-01-05 DIAGNOSIS — J301 Allergic rhinitis due to pollen: Secondary | ICD-10-CM | POA: Diagnosis not present

## 2018-01-05 DIAGNOSIS — J3081 Allergic rhinitis due to animal (cat) (dog) hair and dander: Secondary | ICD-10-CM | POA: Diagnosis not present

## 2018-01-05 DIAGNOSIS — J3089 Other allergic rhinitis: Secondary | ICD-10-CM | POA: Diagnosis not present

## 2018-01-22 DIAGNOSIS — J3089 Other allergic rhinitis: Secondary | ICD-10-CM | POA: Diagnosis not present

## 2018-01-22 DIAGNOSIS — J301 Allergic rhinitis due to pollen: Secondary | ICD-10-CM | POA: Diagnosis not present

## 2018-01-22 DIAGNOSIS — J3081 Allergic rhinitis due to animal (cat) (dog) hair and dander: Secondary | ICD-10-CM | POA: Diagnosis not present

## 2018-01-26 ENCOUNTER — Encounter: Payer: Self-pay | Admitting: Podiatry

## 2018-01-26 ENCOUNTER — Ambulatory Visit: Payer: BLUE CROSS/BLUE SHIELD | Admitting: Podiatry

## 2018-01-26 DIAGNOSIS — M779 Enthesopathy, unspecified: Secondary | ICD-10-CM | POA: Diagnosis not present

## 2018-01-26 DIAGNOSIS — M659 Synovitis and tenosynovitis, unspecified: Secondary | ICD-10-CM

## 2018-01-26 DIAGNOSIS — M7751 Other enthesopathy of right foot: Secondary | ICD-10-CM

## 2018-01-26 MED ORDER — GABAPENTIN 100 MG PO CAPS: 100.0000 mg | ORAL_CAPSULE | Freq: Every day | ORAL | 1 refills | Status: DC

## 2018-01-28 NOTE — Progress Notes (Signed)
   Subjective:  62 year old female presenting today for follow up evaluation of right ankle pain and neuritis of the bilateral toes. She reports continued burning pain to all toes, especially the right 2nd toe. She reports sharp pain when walking. She has not done anything for treatment. Patient is here for further evaluation and treatment.   Past Medical History:  Diagnosis Date  . Arthritis   . Endometrial polyp   . Hypertension   . Hypothyroidism   . Mild depression ([facility])   . Plantar fasciitis   . Tendonitis   . Tendonitis       Objective / Physical Exam:  General:  The patient is alert and oriented x3 in no acute distress. Dermatology:  Skin is warm, dry and supple bilateral lower extremities. Negative for open lesions or macerations. Vascular:  Palpable pedal pulses bilaterally. No edema or erythema noted. Capillary refill within normal limits. Neurological:  Epicritic and protective threshold grossly intact bilaterally. Paresthesia with burning, shooting pain noted to toes 1-5 of bilateral feet.  Musculoskeletal Exam: Range of motion within normal limits to all pedal and ankle joints bilateral. Muscle strength 5/5 in all groups bilateral.   Assessment: #1 synovitis of right ankle - resolved  #2 Neuritis digits 1-5 bilateral secondary to shoe gear  Plan of Care:  #1 Patient was evaluated. #2 Prescription for Gabapentin 100 mg daily provided to patient.  #3 Silicone toe caps dispensed.  #4 Return to clinic in 4 weeks.    Felecia Shelling, DPM [facility]  Dr. Felecia Shelling, DPM    [address]                                        [place], [place] [address]                Office [phone]  Fax [phone]

## 2018-02-23 ENCOUNTER — Ambulatory Visit: Payer: BLUE CROSS/BLUE SHIELD | Admitting: Podiatry

## 2018-02-23 ENCOUNTER — Encounter: Payer: Self-pay | Admitting: Podiatry

## 2018-02-23 DIAGNOSIS — M659 Synovitis and tenosynovitis, unspecified: Secondary | ICD-10-CM

## 2018-02-25 NOTE — Progress Notes (Signed)
   Subjective:  62 year old female presenting today for follow up evaluation of right ankle pain. She states the pain has improved but is still present. She also reports burning pain to the right 2nd toe. Walking and bearing weight increases the symptoms. She hs bot done anything for treatment. Patient is here for further evaluation and treatment.   Past Medical History:  Diagnosis Date  . Arthritis   . Endometrial polyp   . Hypertension   . Hypothyroidism   . Mild depression ([facility])   . Plantar fasciitis   . Tendonitis   . Tendonitis      Objective / Physical Exam:  General:  The patient is alert and oriented x3 in no acute distress. Dermatology:  Hyperkeratotic, discolored, thickened, onychodystrophy of the right 2nd toe. Skin is warm, dry and supple bilateral lower extremities. Negative for open lesions or macerations. Vascular:  Palpable pedal pulses bilaterally. No edema or erythema noted. Capillary refill within normal limits. Neurological:  Epicritic and protective threshold grossly intact bilaterally.  Musculoskeletal Exam:  Pain on palpation to the anterior lateral medial aspects of the patient's right ankle. Mild edema noted. Elongated right 2nd toe. Range of motion within normal limits to all pedal and ankle joints bilateral. Muscle strength 5/5 in all groups bilateral.   Assessment: 1. pain in right ankle 2. synovitis of right ankle 3. Elongated 2nd toe right 4. Painful dystrophic nail 2nd right  Plan of Care:  1. Patient was evaluated. 2. injection of 0.5 mL Celestone Soluspan injected in the patient's right ankle. 3. Mechanical debridement of the nail of the right 2nd toe performed using a nail nipper. Filed with dremel without incident. If no improvement, we will perform a total permanent nail avulsion procedure at the next visit.  4. Continue using silicone toe caps.  5. Return to clinic in 4 weeks.    Felecia ShellingBrent M. Evans, DPM [facility]  Dr.  Felecia ShellingBrent M. Evans, DPM    [address]                                        [place], [place] [address]                Office [phone]  Fax [phone]

## 2018-03-23 ENCOUNTER — Ambulatory Visit: Payer: BLUE CROSS/BLUE SHIELD | Admitting: Podiatry

## 2018-03-23 ENCOUNTER — Encounter: Payer: Self-pay | Admitting: Podiatry

## 2018-03-23 DIAGNOSIS — M659 Synovitis and tenosynovitis, unspecified: Secondary | ICD-10-CM | POA: Diagnosis not present

## 2018-03-29 NOTE — Progress Notes (Signed)
   Subjective:  62 year old female presenting today for follow up evaluation of right ankle pain and pain to the 2nd toe of the right foot. She states the symptoms of both the ankle and the toe have improved. She has been wearing the ankle brace and custom molded orthotics for treatment. There are no modifying factors noted. Patient is here for further evaluation and treatment.   Past Medical History:  Diagnosis Date  . Arthritis   . Endometrial polyp   . Hypertension   . Hypothyroidism   . Mild depression ([facility])   . Plantar fasciitis   . Tendonitis   . Tendonitis      Objective / Physical Exam:  General:  The patient is alert and oriented x3 in no acute distress. Dermatology:  Hyperkeratotic, discolored, thickened, onychodystrophy of the right 2nd toe. Skin is warm, dry and supple bilateral lower extremities. Negative for open lesions or macerations. Vascular:  Palpable pedal pulses bilaterally. No edema or erythema noted. Capillary refill within normal limits. Neurological:  Epicritic and protective threshold grossly intact bilaterally.  Musculoskeletal Exam:  Pain on palpation to the anterior lateral medial aspects of the patient's right ankle. Mild edema noted. Elongated right 2nd toe. Range of motion within normal limits to all pedal and ankle joints bilateral. Muscle strength 5/5 in all groups bilateral.   Assessment: 1. pain in right ankle 2. synovitis of right ankle 3. Elongated 2nd toe right 4. Painful dystrophic nail 2nd right  Plan of Care:  1. Patient was evaluated. 2. injection of 0.5 mL Celestone Soluspan injected in the patient's right ankle. 3. Mechanical debridement of the nail of the right 2nd toe performed using a nail nipper. Filed with dremel without incident. If no improvement, we will perform a total permanent nail avulsion procedure at the next visit.  4. Continue using custom molded orthotics and ankle brace as needed.   5. Return to clinic as needed.      Felecia ShellingBrent M. Caroljean Monsivais, DPM [facility]  Dr. Felecia ShellingBrent M. Babette Stum, DPM    [address]                                        [place], [place] [address]                Office [phone]  Fax [phone]

## 2018-06-22 DIAGNOSIS — I10 Essential (primary) hypertension: Secondary | ICD-10-CM | POA: Diagnosis not present

## 2018-06-22 DIAGNOSIS — R7303 Prediabetes: Secondary | ICD-10-CM | POA: Diagnosis not present

## 2018-06-29 DIAGNOSIS — R7303 Prediabetes: Secondary | ICD-10-CM | POA: Diagnosis not present

## 2018-06-29 DIAGNOSIS — K219 Gastro-esophageal reflux disease without esophagitis: Secondary | ICD-10-CM | POA: Diagnosis not present

## 2018-06-29 DIAGNOSIS — I10 Essential (primary) hypertension: Secondary | ICD-10-CM | POA: Diagnosis not present

## 2018-06-29 DIAGNOSIS — E039 Hypothyroidism, unspecified: Secondary | ICD-10-CM | POA: Diagnosis not present

## 2018-07-29 DIAGNOSIS — J209 Acute bronchitis, unspecified: Secondary | ICD-10-CM | POA: Diagnosis not present

## 2018-08-09 DIAGNOSIS — J40 Bronchitis, not specified as acute or chronic: Secondary | ICD-10-CM | POA: Diagnosis not present

## 2018-08-09 DIAGNOSIS — J209 Acute bronchitis, unspecified: Secondary | ICD-10-CM | POA: Diagnosis not present

## 2018-08-09 DIAGNOSIS — R5383 Other fatigue: Secondary | ICD-10-CM | POA: Diagnosis not present

## 2018-11-29 DIAGNOSIS — H524 Presbyopia: Secondary | ICD-10-CM | POA: Diagnosis not present

## 2018-12-22 DIAGNOSIS — R7303 Prediabetes: Secondary | ICD-10-CM | POA: Diagnosis not present

## 2018-12-22 DIAGNOSIS — I10 Essential (primary) hypertension: Secondary | ICD-10-CM | POA: Diagnosis not present

## 2018-12-29 DIAGNOSIS — Z0000 Encounter for general adult medical examination without abnormal findings: Secondary | ICD-10-CM | POA: Diagnosis not present

## 2018-12-29 DIAGNOSIS — R7303 Prediabetes: Secondary | ICD-10-CM | POA: Diagnosis not present

## 2018-12-29 DIAGNOSIS — I10 Essential (primary) hypertension: Secondary | ICD-10-CM | POA: Diagnosis not present

## 2018-12-29 DIAGNOSIS — D72810 Lymphocytopenia: Secondary | ICD-10-CM | POA: Insufficient documentation

## 2018-12-29 DIAGNOSIS — E039 Hypothyroidism, unspecified: Secondary | ICD-10-CM | POA: Diagnosis not present

## 2019-03-09 ENCOUNTER — Ambulatory Visit (INDEPENDENT_AMBULATORY_CARE_PROVIDER_SITE_OTHER): Payer: BC Managed Care – PPO | Admitting: Orthotics

## 2019-03-09 ENCOUNTER — Other Ambulatory Visit: Payer: Self-pay

## 2019-03-09 DIAGNOSIS — M659 Synovitis and tenosynovitis, unspecified: Secondary | ICD-10-CM | POA: Diagnosis not present

## 2019-03-09 DIAGNOSIS — M7752 Other enthesopathy of left foot: Secondary | ICD-10-CM | POA: Diagnosis not present

## 2019-03-09 DIAGNOSIS — M7751 Other enthesopathy of right foot: Secondary | ICD-10-CM

## 2019-03-09 NOTE — Progress Notes (Signed)
Patient presents today with a hx of PTTD/AAF.  Upon assessment, patient has pronounced pes planus w/ a valgus RF deformity.  Patient has medially shifted talus/navicular.  Goal is provide longitudinal arch support and RF stability.  Plan on deep heel cup, hug arch, wide foot orthosis w/ medial flange and varus correction for RF valgus deformity.  Patient educated in the progessive nature of PTTD and financial responsibility.  

## 2019-03-22 ENCOUNTER — Ambulatory Visit (INDEPENDENT_AMBULATORY_CARE_PROVIDER_SITE_OTHER): Payer: BC Managed Care – PPO

## 2019-03-22 ENCOUNTER — Other Ambulatory Visit: Payer: Self-pay

## 2019-03-22 ENCOUNTER — Ambulatory Visit: Payer: BC Managed Care – PPO | Admitting: Podiatry

## 2019-03-22 ENCOUNTER — Encounter: Payer: Self-pay | Admitting: Podiatry

## 2019-03-22 DIAGNOSIS — M79676 Pain in unspecified toe(s): Secondary | ICD-10-CM

## 2019-03-22 DIAGNOSIS — M778 Other enthesopathies, not elsewhere classified: Secondary | ICD-10-CM

## 2019-03-22 DIAGNOSIS — M779 Enthesopathy, unspecified: Secondary | ICD-10-CM | POA: Diagnosis not present

## 2019-03-22 DIAGNOSIS — B351 Tinea unguium: Secondary | ICD-10-CM

## 2019-03-23 NOTE — Progress Notes (Signed)
   SUBJECTIVE Patient presents to office today complaining of elongated, thickened nails that cause pain while ambulating in shoes. She is unable to trim her own nails. Patient is here for further evaluation and treatment.  Past Medical History:  Diagnosis Date  . Arthritis   . Endometrial polyp   . Hypertension   . Hypothyroidism   . Mild depression ([place])   . Plantar fasciitis   . Tendonitis   . Tendonitis     OBJECTIVE General Patient is awake, alert, and oriented x 3 and in no acute distress. Derm Skin is dry and supple bilateral. Negative open lesions or macerations. Remaining integument unremarkable. Nails are tender, long, thickened and dystrophic with subungual debris, consistent with onychomycosis, 1-5 bilateral. No signs of infection noted. Vasc  DP and PT pedal pulses palpable bilaterally. Temperature gradient within normal limits.  Neuro Epicritic and protective threshold sensation grossly intact bilaterally.  Musculoskeletal Exam Bilateral forefoot pain noted. No symptomatic pedal deformities noted bilateral. Muscular strength within normal limits.  Radiographic Exam:  Normal osseous mineralization. Joint spaces preserved. No fracture/dislocation/boney destruction.    ASSESSMENT 1. Onychodystrophic nails 1-5 bilateral with hyperkeratosis of nails.  2. Onychomycosis of nail due to dermatophyte bilateral 3. Pain in foot bilateral  PLAN OF CARE 1. Patient evaluated today. X-Rays reviewed.  2. Instructed to maintain good pedal hygiene and foot care.  3. Mechanical debridement of nails 1-5 bilaterally performed using a nail nipper. Filed with dremel without incident.  4. Custom orthotics being modified by Liliane Channel, Pedorthist.  5. Return to clinic in 3 mos.    Edrick Kins, DPM [facility]  Dr. Edrick Kins, [place]                                        [place],  [address]                Office [phone]  Fax [phone]

## 2019-03-30 ENCOUNTER — Other Ambulatory Visit: Payer: BC Managed Care – PPO | Admitting: Orthotics

## 2019-04-28 DIAGNOSIS — I10 Essential (primary) hypertension: Secondary | ICD-10-CM | POA: Diagnosis not present

## 2019-04-28 DIAGNOSIS — R7303 Prediabetes: Secondary | ICD-10-CM | POA: Diagnosis not present

## 2019-05-11 DIAGNOSIS — I10 Essential (primary) hypertension: Secondary | ICD-10-CM | POA: Diagnosis not present

## 2019-05-11 DIAGNOSIS — E039 Hypothyroidism, unspecified: Secondary | ICD-10-CM | POA: Diagnosis not present

## 2019-05-11 DIAGNOSIS — D72810 Lymphocytopenia: Secondary | ICD-10-CM | POA: Diagnosis not present

## 2019-05-25 ENCOUNTER — Ambulatory Visit: Payer: BC Managed Care – PPO | Admitting: Orthotics

## 2019-05-25 ENCOUNTER — Other Ambulatory Visit: Payer: Self-pay

## 2019-05-25 DIAGNOSIS — M659 Synovitis and tenosynovitis, unspecified: Secondary | ICD-10-CM

## 2019-05-25 DIAGNOSIS — M778 Other enthesopathies, not elsewhere classified: Secondary | ICD-10-CM

## 2019-05-25 NOTE — Progress Notes (Signed)
Patient reports she feels she is rolling "out" somewhat after walking about a mile; added about 2* RF valgus wedge RF

## 2019-06-08 ENCOUNTER — Ambulatory Visit: Payer: BC Managed Care – PPO | Admitting: Orthotics

## 2019-06-08 ENCOUNTER — Other Ambulatory Visit: Payer: Self-pay

## 2019-06-08 DIAGNOSIS — M778 Other enthesopathies, not elsewhere classified: Secondary | ICD-10-CM

## 2019-06-08 NOTE — Progress Notes (Signed)
Added more valgus wedging to f/o as she still complained she felt she was rolling out.

## 2019-06-21 ENCOUNTER — Encounter: Payer: Self-pay | Admitting: Podiatry

## 2019-06-21 ENCOUNTER — Ambulatory Visit: Payer: BC Managed Care – PPO | Admitting: Podiatry

## 2019-06-21 ENCOUNTER — Other Ambulatory Visit: Payer: Self-pay

## 2019-06-21 DIAGNOSIS — B351 Tinea unguium: Secondary | ICD-10-CM

## 2019-06-21 DIAGNOSIS — M659 Synovitis and tenosynovitis, unspecified: Secondary | ICD-10-CM

## 2019-06-21 DIAGNOSIS — M79676 Pain in unspecified toe(s): Secondary | ICD-10-CM | POA: Diagnosis not present

## 2019-06-21 DIAGNOSIS — M779 Enthesopathy, unspecified: Secondary | ICD-10-CM

## 2019-06-21 DIAGNOSIS — M778 Other enthesopathies, not elsewhere classified: Secondary | ICD-10-CM

## 2019-06-21 NOTE — Progress Notes (Signed)
   SUBJECTIVE Patient presents to office today complaining of elongated, thickened nails that cause pain while ambulating in shoes. She is unable to trim her own nails. Patient is here for further evaluation and treatment.  Past Medical History:  Diagnosis Date  . Arthritis   . Endometrial polyp   . Hypertension   . Hypothyroidism   . Mild depression ([place])   . Plantar fasciitis   . Tendonitis   . Tendonitis     OBJECTIVE General Patient is awake, alert, and oriented x 3 and in no acute distress. Derm Skin is dry and supple bilateral. Negative open lesions or macerations. Remaining integument unremarkable. Nails are tender, long, thickened and dystrophic with subungual debris, consistent with onychomycosis, 1-5 bilateral. No signs of infection noted. Vasc  DP and PT pedal pulses palpable bilaterally. Temperature gradient within normal limits.  Neuro Epicritic and protective threshold sensation grossly intact bilaterally.  Musculoskeletal Exam Bilateral forefoot pain noted. No symptomatic pedal deformities noted bilateral. Muscular strength within normal limits.  ASSESSMENT 1. Onychodystrophic nails 1-5 bilateral with hyperkeratosis of nails.  2. Onychomycosis of nail due to dermatophyte bilateral 3. Pain in foot bilateral  PLAN OF CARE 1. Patient evaluated today. X-Rays reviewed.  2. Instructed to maintain good pedal hygiene and foot care.  3. Mechanical debridement of nails 1-5 bilaterally performed using a nail nipper. Filed with dremel without incident.  4. OTC insoles provided to patient today.   5. Return to clinic in 3 mos.    Edrick Kins, DPM [facility]  Dr. Edrick Kins, [place]                                        [place], [place] [address]                Office [phone]  Fax [phone]

## 2019-07-27 ENCOUNTER — Other Ambulatory Visit: Payer: Self-pay

## 2019-07-27 ENCOUNTER — Ambulatory Visit: Payer: BC Managed Care – PPO | Admitting: Orthotics

## 2019-07-27 DIAGNOSIS — M778 Other enthesopathies, not elsewhere classified: Secondary | ICD-10-CM

## 2019-07-27 NOTE — Progress Notes (Signed)
Needs adjustments:  Remake LEFT device only:  Deep heel cup/ 4* lateral skive, 3* RF post and 3* ff post, lateral flange.

## 2019-08-10 ENCOUNTER — Other Ambulatory Visit: Payer: Self-pay | Admitting: Obstetrics & Gynecology

## 2019-08-10 ENCOUNTER — Other Ambulatory Visit: Payer: Self-pay

## 2019-08-10 DIAGNOSIS — Z1231 Encounter for screening mammogram for malignant neoplasm of breast: Secondary | ICD-10-CM

## 2019-08-24 ENCOUNTER — Ambulatory Visit: Payer: BC Managed Care – PPO | Admitting: Orthotics

## 2019-08-24 ENCOUNTER — Other Ambulatory Visit: Payer: Self-pay

## 2019-08-24 DIAGNOSIS — M659 Synovitis and tenosynovitis, unspecified: Secondary | ICD-10-CM

## 2019-08-24 DIAGNOSIS — M7751 Other enthesopathy of right foot: Secondary | ICD-10-CM

## 2019-08-24 NOTE — Progress Notes (Signed)
Patient picked up corrected f/o 

## 2019-09-06 ENCOUNTER — Other Ambulatory Visit (HOSPITAL_COMMUNITY)
Admission: RE | Admit: 2019-09-06 | Discharge: 2019-09-06 | Disposition: A | Discharge status: Home / Self Care | Payer: BC Managed Care – PPO | Source: Ambulatory Visit | Attending: Obstetrics & Gynecology | Admitting: Obstetrics & Gynecology

## 2019-09-06 ENCOUNTER — Encounter: Payer: Self-pay | Admitting: Obstetrics & Gynecology

## 2019-09-06 ENCOUNTER — Other Ambulatory Visit: Payer: Self-pay

## 2019-09-06 ENCOUNTER — Ambulatory Visit (INDEPENDENT_AMBULATORY_CARE_PROVIDER_SITE_OTHER): Payer: BC Managed Care – PPO | Admitting: Obstetrics & Gynecology

## 2019-09-06 VITALS — BP 122/88 | HR 97 | Ht 66.0 in | Wt 242.0 lb

## 2019-09-06 DIAGNOSIS — Z124 Encounter for screening for malignant neoplasm of cervix: Secondary | ICD-10-CM | POA: Diagnosis not present

## 2019-09-06 DIAGNOSIS — Z1211 Encounter for screening for malignant neoplasm of colon: Secondary | ICD-10-CM

## 2019-09-06 DIAGNOSIS — Z1231 Encounter for screening mammogram for malignant neoplasm of breast: Secondary | ICD-10-CM

## 2019-09-06 DIAGNOSIS — Z01419 Encounter for gynecological examination (general) (routine) without abnormal findings: Secondary | ICD-10-CM | POA: Diagnosis not present

## 2019-09-06 NOTE — Progress Notes (Signed)
HPI:      [name] is a 63 y.o. G2P2002 who LMP was in the past, she presents today for her annual examination.  The patient has no complaints today. The patient is not currently sexually active. Herlast pap: approximate date 2017 and was normal and last mammogram: approximate date 2018 and was normal.  The patient does perform self breast exams.  There is notable family history of breast or ovarian cancer in her family.  Ovarian cancer w mother age 50, has not had BRCA testing. The patient is not taking hormone replacement therapy. Patient denies post-menopausal vaginal bleeding.   The patient has regular exercise: yes. The patient denies current symptoms of depression.    GYN Hx: Last Colonoscopy:7 years ago. Normal.  Last DEXA: never ago.    PMHx: Past Medical History:  Diagnosis Date  . Arthritis   . Endometrial polyp   . Hypertension   . Hypothyroidism   . Mild depression ([place])   . Plantar fasciitis   . Tendonitis   . Tendonitis    Past Surgical History:  Procedure Laterality Date  . ENDOMETRIAL BIOPSY    . HYSTEROSCOPY    . TUBAL LIGATION    . WISDOM TOOTH EXTRACTION     Family History  Problem Relation Age of Onset  . Cancer Mother 62       ovarian  . Hypertension Father   . Lung cancer Father   . Stomach cancer Maternal Aunt 28  . Breast cancer Neg Hx    Social History   Tobacco Use  . Smoking status: Never Smoker  . Smokeless tobacco: Never Used  Substance Use Topics  . Alcohol use: No  . Drug use: No    Current Outpatient Medications:  .  amLODipine (NORVASC) 2.5 MG tablet, TAKE 1 TABLET DAILY, Disp: , Rfl:  .  amLODipine (NORVASC) 2.5 MG tablet, TAKE 1 TABLET DAILY, Disp: , Rfl:  .  aspirin EC 81 MG tablet, Take by mouth., Disp: , Rfl:  .  EPINEPHrine (EPIPEN 2-PAK) 0.3 mg/0.3 mL IJ SOAJ injection, Inject into the muscle., Disp: , Rfl:  .  fluticasone (FLONASE) 50 MCG/ACT nasal spray, Place into the nose., Disp: , Rfl:  .   Fluticasone-Salmeterol (ADVAIR DISKUS) 250-50 MCG/DOSE AEPB, Inhale into the lungs., Disp: , Rfl:  .  gabapentin (NEURONTIN) 100 MG capsule, Take 1 capsule (100 mg total) by mouth at bedtime., Disp: 30 capsule, Rfl: 1 .  hydrochlorothiazide (HYDRODIURIL) 25 MG tablet, Take by mouth., Disp: , Rfl:  .  levothyroxine (SYNTHROID, LEVOTHROID) 100 MCG tablet, TAKE 1 TABLET DAILY. TAKE ON AN EMPTY STOMACH WITH A GLASS OF WATER AT LEAST 30 TO 60 MINUTES BEFORE BREAKFAST, Disp: , Rfl:  .  omeprazole (PRILOSEC) 20 MG capsule, , Disp: , Rfl:  .  omeprazole (PRILOSEC) 20 MG capsule, Take by mouth., Disp: , Rfl:   Current Facility-Administered Medications:  .  betamethasone acetate-betamethasone sodium phosphate (CELESTONE) injection 3 mg, 3 mg, Intramuscular, Once, Evans, Brent M, DPM .  betamethasone acetate-betamethasone sodium phosphate (CELESTONE) injection 3 mg, 3 mg, Intramuscular, Once, Evans, Brent M, DPM Allergies: Dust mite extract, Pollen extract, and Tree extract  Review of Systems  Constitutional: Negative for chills, fever and malaise/fatigue.  HENT: Negative for congestion, sinus pain and sore throat.   Eyes: Negative for blurred vision and pain.  Respiratory: Positive for cough. Negative for wheezing.   Cardiovascular: Negative for chest pain and leg swelling.  Gastrointestinal: Negative for abdominal pain, constipation,  diarrhea, heartburn, nausea and vomiting.  Genitourinary: Negative for dysuria, frequency, hematuria and urgency.  Musculoskeletal: Positive for joint pain. Negative for back pain, myalgias and neck pain.  Skin: Negative for itching and rash.  Neurological: Negative for dizziness, tremors and weakness.  Endo/Heme/Allergies: Does not bruise/bleed easily.  Psychiatric/Behavioral: Negative for depression. The patient is not nervous/anxious and does not have insomnia.     Objective: BP 122/88   Pulse 97   Ht _0  (1.676 m)   Wt 242 lb (109.8 kg)   BMI 39.06 kg/m    Filed Weights   09/06/19 1032  Weight: 242 lb (109.8 kg)   Body mass index is 39.06 kg/m. Physical Exam Constitutional:      General: She is not in acute distress.    Appearance: She is well-developed.  Genitourinary:     Pelvic exam was performed with patient supine.     Vagina, uterus and rectum normal.     No lesions in the vagina.     No vaginal bleeding.     No cervical motion tenderness, friability, lesion or polyp.     Uterus is mobile.     Uterus is not enlarged.     No uterine mass detected.    Uterus is midaxial.     No right or left adnexal mass present.     Right adnexa not tender.     Left adnexa not tender.  HENT:     Head: Normocephalic and atraumatic. No laceration.     Right Ear: Hearing normal.     Left Ear: Hearing normal.     Mouth/Throat:     Pharynx: Uvula midline.  Eyes:     Pupils: Pupils are equal, round, and reactive to light.  Neck:     Thyroid: No thyromegaly.  Cardiovascular:     Rate and Rhythm: Normal rate and regular rhythm.     Heart sounds: No murmur. No friction rub. No gallop.   Pulmonary:     Effort: Pulmonary effort is normal. No respiratory distress.     Breath sounds: Normal breath sounds. No wheezing.  Chest:     Breasts:        Right: No mass, skin change or tenderness.        Left: No mass, skin change or tenderness.  Abdominal:     General: Bowel sounds are normal. There is no distension.     Palpations: Abdomen is soft.     Tenderness: There is no abdominal tenderness. There is no rebound.  Musculoskeletal:        General: Normal range of motion.     Cervical back: Normal range of motion and neck supple.  Neurological:     Mental Status: She is alert and oriented to person, place, and time.     Cranial Nerves: No cranial nerve deficit.  Skin:    General: Skin is warm and dry.  Psychiatric:        Judgment: Judgment normal.  Vitals reviewed.     Assessment: Annual Exam 1. Women's annual routine gynecological  examination   2. Encounter for screening mammogram for malignant neoplasm of breast   3. Screen for colon cancer   4. Screening for cervical cancer     Plan:            1.  Cervical Screening-  Pap smear done today  2. Breast screening- Exam annually and mammogram scheduled. Planned for 11/28/2019  3. Colonoscopy every 10 years, Hemoccult testing  after age 58  4. Labs managed by PCP  5. Counseling for hormonal therapy: none              6. FRAX - FRAX score for assessing the 10 year probability for fracture calculated and discussed today.  Based on age and score today, DEXA is not currently scheduled.   7. Considering BRCA testing but not today    F/U  Return in about 1 year (around 09/05/2020) for Annual.  Barnett Applebaum, MD, [facility], [facility] 09/06/2019  11:11 AM

## 2019-09-06 NOTE — Patient Instructions (Signed)
PAP every three years Mammogram every year    Call [phone] to schedule at [place] appt Mar 2021 Colonoscopy every 10 years    Stool cards given today Labs yearly (with PCP)

## 2019-09-08 LAB — CYTOLOGY - PAP
Comment: NEGATIVE
Diagnosis: NEGATIVE
High risk HPV: NEGATIVE

## 2019-09-27 ENCOUNTER — Encounter: Payer: Self-pay | Admitting: Podiatry

## 2019-09-27 ENCOUNTER — Ambulatory Visit: Payer: BC Managed Care – PPO | Admitting: Podiatry

## 2019-09-27 ENCOUNTER — Other Ambulatory Visit: Payer: Self-pay

## 2019-09-27 DIAGNOSIS — M79676 Pain in unspecified toe(s): Secondary | ICD-10-CM | POA: Diagnosis not present

## 2019-09-27 DIAGNOSIS — B351 Tinea unguium: Secondary | ICD-10-CM

## 2019-09-30 NOTE — Progress Notes (Signed)
   SUBJECTIVE Patient presents to office today complaining of elongated, thickened nails that cause pain while ambulating in shoes. She is unable to trim her own nails.  She states her custom orthotics have been causing pain and change her gait. She was seen by her PCP for leg pain and was given Meloxicam which helps the symptoms. Patient is here for further evaluation and treatment.   Past Medical History:  Diagnosis Date  . Arthritis   . Endometrial polyp   . Hypertension   . Hypothyroidism   . Mild depression ([facility])   . Plantar fasciitis   . Tendonitis   . Tendonitis     OBJECTIVE General Patient is awake, alert, and oriented x 3 and in no acute distress. Derm Skin is dry and supple bilateral. Negative open lesions or macerations. Remaining integument unremarkable. Nails are tender, long, thickened and dystrophic with subungual debris, consistent with onychomycosis, 1-5 bilateral. No signs of infection noted. Vasc  DP and PT pedal pulses palpable bilaterally. Temperature gradient within normal limits.  Neuro Epicritic and protective threshold sensation grossly intact bilaterally.  Musculoskeletal Exam Bilateral forefoot pain noted. No symptomatic pedal deformities noted bilateral. Muscular strength within normal limits.  ASSESSMENT 1. Onychodystrophic nails 1-5 bilateral with hyperkeratosis of nails.  2. Onychomycosis of nail due to dermatophyte bilateral 3. Pain in foot bilateral  PLAN OF CARE 1. Patient evaluated today. X-Rays reviewed.  2. Instructed to maintain good pedal hygiene and foot care.  3. Mechanical debridement of nails 1-5 bilaterally performed using a nail nipper. Filed with dremel without incident.  4. Continue wearing New Balance shoes.  5. Discontinue using custom orthotics due to discomfort.  6. Resume using OTC insoles.  7. Return to clinic in 3 months.    Felecia Shelling, DPM [facility]  Dr. Felecia Shelling, DPM    [address]                                         [place], [place] [address]                Office [phone]  Fax [phone]

## 2019-11-14 ENCOUNTER — Other Ambulatory Visit: Payer: Self-pay | Admitting: Orthopedic Surgery

## 2019-11-14 DIAGNOSIS — M25461 Effusion, right knee: Secondary | ICD-10-CM

## 2019-11-14 DIAGNOSIS — M1711 Unilateral primary osteoarthritis, right knee: Secondary | ICD-10-CM

## 2019-11-21 ENCOUNTER — Ambulatory Visit
Admission: RE | Admit: 2019-11-21 | Discharge: 2019-11-21 | Disposition: A | Discharge status: Home / Self Care | Payer: BC Managed Care – PPO | Source: Ambulatory Visit | Attending: Orthopedic Surgery | Admitting: Orthopedic Surgery

## 2019-11-21 ENCOUNTER — Other Ambulatory Visit: Payer: Self-pay

## 2019-11-21 DIAGNOSIS — M25461 Effusion, right knee: Secondary | ICD-10-CM | POA: Insufficient documentation

## 2019-11-21 DIAGNOSIS — M1711 Unilateral primary osteoarthritis, right knee: Secondary | ICD-10-CM | POA: Diagnosis present

## 2019-11-25 ENCOUNTER — Ambulatory Visit: Payer: BC Managed Care – PPO | Admitting: Podiatry

## 2019-11-25 ENCOUNTER — Other Ambulatory Visit: Payer: Self-pay

## 2019-11-25 ENCOUNTER — Encounter: Payer: Self-pay | Admitting: Podiatry

## 2019-11-25 VITALS — Temp 97.9°F

## 2019-11-25 DIAGNOSIS — L989 Disorder of the skin and subcutaneous tissue, unspecified: Secondary | ICD-10-CM | POA: Diagnosis not present

## 2019-11-28 ENCOUNTER — Ambulatory Visit
Admission: RE | Admit: 2019-11-28 | Discharge: 2019-11-28 | Disposition: A | Discharge status: Home / Self Care | Payer: BC Managed Care – PPO | Source: Ambulatory Visit | Attending: Obstetrics & Gynecology | Admitting: Obstetrics & Gynecology

## 2019-11-28 DIAGNOSIS — Z1231 Encounter for screening mammogram for malignant neoplasm of breast: Secondary | ICD-10-CM | POA: Insufficient documentation

## 2019-11-29 NOTE — Progress Notes (Signed)
   Subjective: 64 y.o. female presenting to the office today with a chief complaint of a lesion on the right 2nd toe that appeared two weeks ago. He reports significant pain and intermittent burning of the area. She states she has gotten lesions like this in the past but they normally resolve on their own by now. She has not done anything for treatment. Walking increases her pain. Patient is here for further evaluation and treatment.   Past Medical History:  Diagnosis Date  . Arthritis   . Endometrial polyp   . Hypertension   . Hypothyroidism   . Mild depression ([facility])   . Plantar fasciitis   . Tendonitis   . Tendonitis      Objective:  Physical Exam General: Alert and oriented x3 in no acute distress  Dermatology: Hyperkeratotic lesion(s) present on the right 2nd toe. Pain on palpation with a central nucleated core noted. Skin is warm, dry and supple bilateral lower extremities. Negative for open lesions or macerations.  Vascular: Palpable pedal pulses bilaterally. No edema or erythema noted. Capillary refill within normal limits.  Neurological: Epicritic and protective threshold grossly intact bilaterally.   Musculoskeletal Exam: Pain on palpation at the keratotic lesion(s) noted. Range of motion within normal limits bilateral. Muscle strength 5/5 in all groups bilateral.  Assessment: 1. Pre-ulcerative callus lesion noted to the right 2nd toe   Plan of Care:  1. Patient evaluated 2. Excisional debridement of keratoic lesion(s) using a chisel blade was performed without incident.  3. Dressed area with light dressing. 4. Patient is to return to the clinic in 2 months for routine care.  Patient scheduled to have right knee surgery.     Felecia Shelling, DPM [facility]  Dr. Felecia Shelling, DPM    [address]                                        [place], [place] [address]                Office [phone]  Fax [phone]

## 2019-12-12 ENCOUNTER — Other Ambulatory Visit: Payer: Self-pay | Admitting: Orthopedic Surgery

## 2019-12-15 ENCOUNTER — Encounter
Admission: RE | Admit: 2019-12-15 | Discharge: 2019-12-15 | Disposition: A | Discharge status: Home / Self Care | Payer: BC Managed Care – PPO | Source: Ambulatory Visit | Attending: Orthopedic Surgery | Admitting: Orthopedic Surgery

## 2019-12-15 ENCOUNTER — Other Ambulatory Visit: Payer: Self-pay

## 2019-12-15 DIAGNOSIS — I10 Essential (primary) hypertension: Secondary | ICD-10-CM | POA: Insufficient documentation

## 2019-12-15 DIAGNOSIS — Z01818 Encounter for other preprocedural examination: Secondary | ICD-10-CM | POA: Diagnosis not present

## 2019-12-15 HISTORY — DX: Gastro-esophageal reflux disease without esophagitis: K21.9

## 2019-12-15 NOTE — Patient Instructions (Addendum)
Your procedure is scheduled on: 12-22-19 THURSDAY Report to Same Day Surgery 2nd floor medical mall [facility] Entrance-take elevator on left to 2nd floor.  Check in with surgery information desk.) To find out your arrival time please call [phone] between 1PM - 3PM on 12-21-19 [facility]  Remember: Instructions that are not followed completely may result in serious medical risk, up to and including death, or upon the discretion of your surgeon and anesthesiologist your surgery may need to be rescheduled.    _x___ 1. Do not eat food after midnight the night before your procedure. NO GUM OR CANDY AFTER MIDNIGHT. You may drink clear liquids up to 2 hours before you are scheduled to arrive at the hospital for your procedure.  Do not drink clear liquids within 2 hours of your scheduled arrival to the hospital.  Clear liquids include  --Water or Apple juice without pulp  --Gatorade  --Black Coffee or Clear Tea (No milk, no creamers, do not add anything to the coffee or Tea   ____Ensure clear carbohydrate drink on the way to the hospital for bariatric patients  _X___Ensure clear carbohydrate drink-FINISH DRINK 2 HOURS PRIOR TO [place]     __x__ 2. No Alcohol for 24 hours before or after surgery.   __x__3. No Smoking or e-cigarettes for 24 prior to surgery.  Do not use any chewable tobacco products for at least 6 hour prior to surgery   ____  4. Bring all medications with you on the day of surgery if instructed.    __x__ 5. Notify your doctor if there is any change in your medical condition     (cold, fever, infections).    x___6. On the morning of surgery brush your teeth with toothpaste and water.  You may rinse your mouth with mouth wash if you wish.  Do not swallow any toothpaste or mouthwash.   Do not wear jewelry, make-up, hairpins, clips or nail polish.  Do not wear lotions, powders, or perfumes.   Do not shave 48 hours prior to surgery. Men may shave face and  neck.  Do not bring valuables to the hospital.    [facility] is not responsible for any belongings or valuables.               Contacts, dentures or bridgework may not be worn into surgery.  Leave your suitcase in the car. After surgery it may be brought to your room.  For patients admitted to the hospital, discharge time is determined by your treatment team.  _  Patients discharged the day of surgery will not be allowed to drive home.  You will need someone to drive you home and stay with you the night of your procedure.    Please read over the following fact sheets that you were given:   [facility] Preparing for Surgery/INCENTIVE SPIROMETER INSTRUCTIONS  _x___ TAKE THE FOLLOWING MEDICATION THE MORNING OF SURGERY WITH A SMALL SIP OF WATER. These include:  1. SYNTHROID (LEVOTHYROXINE)  2. NORVASC (AMLODIPINE)  3. PRILOSEC (OMEPRAZOLE)  4. TAKE A PRILOSEC THE NIGHT BEFORE YOUR SURGERY  5.  6.  ____Fleets enema or Magnesium Citrate as directed.   _x___ Use CHG Soap or sage wipes as directed on instruction sheet   ____ Use inhalers on the day of surgery and bring to hospital day of surgery  ____ Stop Metformin and Janumet 2 days prior to surgery.    ____ Take 1/2 of usual insulin dose the night  before surgery and none on the morning surgery.   ____ Follow recommendations from Cardiologist, Pulmonologist or PCP regarding stopping Aspirin, Coumadin, Plavix ,Eliquis, Effient, or Pradaxa, and Pletal.  X____Stop Anti-inflammatories such as Advil, Aleve, Ibuprofen, Motrin, Naproxen,MOBIC (MELOXICAM) Naprosyn, Goodies powders or aspirin products NOW-OK to take Tylenol    ____ Stop supplements until after surgery.     ____ Bring C-Pap to the hospital.

## 2019-12-15 NOTE — Pre-Procedure Instructions (Signed)
ECG 12-lead10/15/2018 [facility] Component Name Value Ref Range  Vent Rate (bpm) 80   PR Interval (msec) 134   QRS Interval (msec) 68   QT Interval (msec) 398   QTc (msec) 459   Other Result Information  This result has an attachment that is not available.  Result Narrative  Normal sinus rhythm Possible Left atrial enlargement Borderline ECG No previous ECGs available I reviewed and concur with this report. Electronically signed TV:[email or web address] MD, Trisha Mangle ([id number]) on 09/29/2017 9:39:58 AM  Status Results Details   Encounter Summary

## 2019-12-19 ENCOUNTER — Other Ambulatory Visit: Payer: Self-pay

## 2019-12-19 ENCOUNTER — Encounter
Admission: RE | Admit: 2019-12-19 | Discharge: 2019-12-19 | Disposition: A | Discharge status: Home / Self Care | Payer: BC Managed Care – PPO | Source: Ambulatory Visit | Attending: Orthopedic Surgery | Admitting: Orthopedic Surgery

## 2019-12-19 DIAGNOSIS — Z01818 Encounter for other preprocedural examination: Secondary | ICD-10-CM | POA: Insufficient documentation

## 2019-12-19 DIAGNOSIS — I10 Essential (primary) hypertension: Secondary | ICD-10-CM | POA: Diagnosis not present

## 2019-12-20 ENCOUNTER — Other Ambulatory Visit
Admission: RE | Admit: 2019-12-20 | Discharge: 2019-12-20 | Disposition: A | Discharge status: Home / Self Care | Payer: BC Managed Care – PPO | Source: Ambulatory Visit | Attending: Orthopedic Surgery | Admitting: Orthopedic Surgery

## 2019-12-20 DIAGNOSIS — Z01812 Encounter for preprocedural laboratory examination: Secondary | ICD-10-CM | POA: Diagnosis not present

## 2019-12-20 DIAGNOSIS — Z20822 Contact with and (suspected) exposure to covid-19: Secondary | ICD-10-CM | POA: Diagnosis not present

## 2019-12-20 LAB — SARS CORONAVIRUS 2 (TAT 6-24 HRS): SARS Coronavirus 2: NEGATIVE

## 2019-12-22 ENCOUNTER — Encounter: Payer: Self-pay | Admitting: Orthopedic Surgery

## 2019-12-22 ENCOUNTER — Ambulatory Visit: Payer: BC Managed Care – PPO

## 2019-12-22 ENCOUNTER — Other Ambulatory Visit: Payer: Self-pay

## 2019-12-22 ENCOUNTER — Ambulatory Visit: Payer: BC Managed Care – PPO | Admitting: Anesthesiology

## 2019-12-22 ENCOUNTER — Encounter
Admission: RE | Disposition: A | Discharge status: Home / Self Care | Payer: Self-pay | Source: Home / Self Care | Attending: Orthopedic Surgery

## 2019-12-22 ENCOUNTER — Ambulatory Visit
Admission: RE | Admit: 2019-12-22 | Discharge: 2019-12-22 | Disposition: A | Discharge status: Home / Self Care | Payer: BC Managed Care – PPO | Attending: Orthopedic Surgery | Admitting: Orthopedic Surgery

## 2019-12-22 DIAGNOSIS — Z6841 Body Mass Index (BMI) 40.0 and over, adult: Secondary | ICD-10-CM | POA: Insufficient documentation

## 2019-12-22 DIAGNOSIS — I10 Essential (primary) hypertension: Secondary | ICD-10-CM | POA: Diagnosis not present

## 2019-12-22 DIAGNOSIS — E039 Hypothyroidism, unspecified: Secondary | ICD-10-CM | POA: Diagnosis not present

## 2019-12-22 DIAGNOSIS — M1990 Unspecified osteoarthritis, unspecified site: Secondary | ICD-10-CM | POA: Insufficient documentation

## 2019-12-22 DIAGNOSIS — Z809 Family history of malignant neoplasm, unspecified: Secondary | ICD-10-CM | POA: Insufficient documentation

## 2019-12-22 DIAGNOSIS — Z825 Family history of asthma and other chronic lower respiratory diseases: Secondary | ICD-10-CM | POA: Diagnosis not present

## 2019-12-22 DIAGNOSIS — Y939 Activity, unspecified: Secondary | ICD-10-CM | POA: Diagnosis not present

## 2019-12-22 DIAGNOSIS — M84351A Stress fracture, right femur, initial encounter for fracture: Secondary | ICD-10-CM | POA: Diagnosis not present

## 2019-12-22 DIAGNOSIS — Z9109 Other allergy status, other than to drugs and biological substances: Secondary | ICD-10-CM | POA: Diagnosis not present

## 2019-12-22 DIAGNOSIS — Z91030 Bee allergy status: Secondary | ICD-10-CM | POA: Diagnosis not present

## 2019-12-22 DIAGNOSIS — M6751 Plica syndrome, right knee: Secondary | ICD-10-CM | POA: Diagnosis not present

## 2019-12-22 DIAGNOSIS — M722 Plantar fascial fibromatosis: Secondary | ICD-10-CM | POA: Diagnosis not present

## 2019-12-22 DIAGNOSIS — R7303 Prediabetes: Secondary | ICD-10-CM | POA: Insufficient documentation

## 2019-12-22 DIAGNOSIS — Z419 Encounter for procedure for purposes other than remedying health state, unspecified: Secondary | ICD-10-CM

## 2019-12-22 DIAGNOSIS — Z79899 Other long term (current) drug therapy: Secondary | ICD-10-CM | POA: Diagnosis not present

## 2019-12-22 DIAGNOSIS — K219 Gastro-esophageal reflux disease without esophagitis: Secondary | ICD-10-CM | POA: Diagnosis not present

## 2019-12-22 DIAGNOSIS — F329 Major depressive disorder, single episode, unspecified: Secondary | ICD-10-CM | POA: Diagnosis not present

## 2019-12-22 DIAGNOSIS — Z7951 Long term (current) use of inhaled steroids: Secondary | ICD-10-CM | POA: Diagnosis not present

## 2019-12-22 DIAGNOSIS — X58XXXA Exposure to other specified factors, initial encounter: Secondary | ICD-10-CM | POA: Diagnosis not present

## 2019-12-22 DIAGNOSIS — M1711 Unilateral primary osteoarthritis, right knee: Secondary | ICD-10-CM | POA: Insufficient documentation

## 2019-12-22 DIAGNOSIS — S83231A Complex tear of medial meniscus, current injury, right knee, initial encounter: Secondary | ICD-10-CM | POA: Diagnosis present

## 2019-12-22 HISTORY — PX: KNEE ARTHROSCOPY WITH EXCISION PLICA: SHX5647

## 2019-12-22 HISTORY — PX: KNEE ARTHROSCOPY WITH MEDIAL MENISECTOMY: SHX5651

## 2019-12-22 LAB — GLUCOSE, CAPILLARY
Glucose-Capillary: 101 mg/dL — ABNORMAL HIGH (ref 70–99)
Glucose-Capillary: 106 mg/dL — ABNORMAL HIGH (ref 70–99)

## 2019-12-22 SURGERY — ARTHROSCOPY, KNEE, WITH MEDIAL MENISCECTOMY
Anesthesia: General | Site: Knee | Laterality: Right

## 2019-12-22 MED ORDER — ONDANSETRON HCL 4 MG/2ML IJ SOLN
INTRAMUSCULAR | Status: DC | PRN
  Administered 2019-12-22: 4 mg via INTRAVENOUS

## 2019-12-22 MED ORDER — CHLORHEXIDINE GLUCONATE 4 % EX LIQD
60.0000 mL | Freq: Once | CUTANEOUS | Status: AC
  Administered 2019-12-22: 4 via TOPICAL

## 2019-12-22 MED ORDER — MIDAZOLAM HCL 2 MG/2ML IJ SOLN
INTRAMUSCULAR | Status: AC
  Filled 2019-12-22: qty 2

## 2019-12-22 MED ORDER — EPINEPHRINE PF 1 MG/ML IJ SOLN
INTRAMUSCULAR | Status: AC
  Filled 2019-12-22: qty 1

## 2019-12-22 MED ORDER — HYDROCODONE-ACETAMINOPHEN 5-325 MG PO TABS: 1.0000 | ORAL_TABLET | Freq: Four times a day (QID) | ORAL | 0 refills | Status: DC | PRN

## 2019-12-22 MED ORDER — FENTANYL CITRATE (PF) 100 MCG/2ML IJ SOLN
INTRAMUSCULAR | Status: AC
  Filled 2019-12-22: qty 2

## 2019-12-22 MED ORDER — LACTATED RINGERS IV SOLN: INTRAVENOUS | Status: DC

## 2019-12-22 MED ORDER — OXYCODONE HCL 5 MG PO TABS
5.0000 mg | ORAL_TABLET | Freq: Once | ORAL | Status: AC | PRN
  Administered 2019-12-22: 5 mg via ORAL

## 2019-12-22 MED ORDER — ROCURONIUM BROMIDE 100 MG/10ML IV SOLN
INTRAVENOUS | Status: DC | PRN
  Administered 2019-12-22: 5 mg via INTRAVENOUS

## 2019-12-22 MED ORDER — ONDANSETRON HCL 4 MG PO TABS: 4.0000 mg | ORAL_TABLET | Freq: Four times a day (QID) | ORAL | Status: DC | PRN

## 2019-12-22 MED ORDER — PROMETHAZINE HCL 25 MG/ML IJ SOLN
6.2500 mg | INTRAMUSCULAR | Status: DC | PRN
Start: 2019-12-22 — End: 2019-12-22

## 2019-12-22 MED ORDER — CEFAZOLIN SODIUM-DEXTROSE 2-4 GM/100ML-% IV SOLN
INTRAVENOUS | Status: AC
  Filled 2019-12-22: qty 100

## 2019-12-22 MED ORDER — PHENYLEPHRINE HCL (PRESSORS) 10 MG/ML IV SOLN
INTRAVENOUS | Status: DC | PRN
  Administered 2019-12-22: 50 ug via INTRAVENOUS

## 2019-12-22 MED ORDER — ONDANSETRON HCL 4 MG/2ML IJ SOLN: 4.0000 mg | Freq: Four times a day (QID) | INTRAMUSCULAR | Status: DC | PRN

## 2019-12-22 MED ORDER — OXYCODONE HCL 5 MG/5ML PO SOLN: 5.0000 mg | Freq: Once | ORAL | Status: AC | PRN

## 2019-12-22 MED ORDER — BUPIVACAINE HCL (PF) 0.5 % IJ SOLN
INTRAMUSCULAR | Status: AC
  Filled 2019-12-22: qty 30

## 2019-12-22 MED ORDER — PROPOFOL 10 MG/ML IV BOLUS
INTRAVENOUS | Status: AC
  Filled 2019-12-22: qty 20

## 2019-12-22 MED ORDER — FENTANYL CITRATE (PF) 100 MCG/2ML IJ SOLN
INTRAMUSCULAR | Status: DC | PRN
  Administered 2019-12-22 (×2): 50 ug via INTRAVENOUS

## 2019-12-22 MED ORDER — DEXMEDETOMIDINE HCL 200 MCG/2ML IV SOLN
INTRAVENOUS | Status: DC | PRN
  Administered 2019-12-22: 8 ug via INTRAVENOUS
  Administered 2019-12-22 (×3): 4 ug via INTRAVENOUS

## 2019-12-22 MED ORDER — SODIUM CHLORIDE 0.9 % IV SOLN: INTRAVENOUS | Status: DC

## 2019-12-22 MED ORDER — FENTANYL CITRATE (PF) 100 MCG/2ML IJ SOLN
INTRAMUSCULAR | Status: AC
  Administered 2019-12-22: 12:00:00 25 ug via INTRAVENOUS
  Filled 2019-12-22: qty 2

## 2019-12-22 MED ORDER — MIDAZOLAM HCL 2 MG/2ML IJ SOLN
INTRAMUSCULAR | Status: DC | PRN
  Administered 2019-12-22 (×2): 1 mg via INTRAVENOUS

## 2019-12-22 MED ORDER — METOCLOPRAMIDE HCL 10 MG PO TABS: 5.0000 mg | ORAL_TABLET | Freq: Three times a day (TID) | ORAL | Status: DC | PRN

## 2019-12-22 MED ORDER — LIDOCAINE HCL (CARDIAC) PF 100 MG/5ML IV SOSY
PREFILLED_SYRINGE | INTRAVENOUS | Status: DC | PRN
  Administered 2019-12-22: 100 mg via INTRAVENOUS

## 2019-12-22 MED ORDER — METOCLOPRAMIDE HCL 5 MG/ML IJ SOLN: 5.0000 mg | Freq: Three times a day (TID) | INTRAMUSCULAR | Status: DC | PRN

## 2019-12-22 MED ORDER — SUCCINYLCHOLINE CHLORIDE 20 MG/ML IJ SOLN
INTRAMUSCULAR | Status: DC | PRN
  Administered 2019-12-22: 120 mg via INTRAVENOUS

## 2019-12-22 MED ORDER — OXYCODONE HCL 5 MG PO TABS
ORAL_TABLET | ORAL | Status: AC
  Filled 2019-12-22: qty 1

## 2019-12-22 MED ORDER — CEFAZOLIN SODIUM-DEXTROSE 2-4 GM/100ML-% IV SOLN
2.0000 g | INTRAVENOUS | Status: AC
  Administered 2019-12-22: 2 g via INTRAVENOUS

## 2019-12-22 MED ORDER — DEXAMETHASONE SODIUM PHOSPHATE 10 MG/ML IJ SOLN
INTRAMUSCULAR | Status: DC | PRN
  Administered 2019-12-22: 10 mg via INTRAVENOUS

## 2019-12-22 MED ORDER — FENTANYL CITRATE (PF) 100 MCG/2ML IJ SOLN
25.0000 ug | INTRAMUSCULAR | Status: DC | PRN
  Administered 2019-12-22 (×2): 25 ug via INTRAVENOUS

## 2019-12-22 MED ORDER — PROPOFOL 10 MG/ML IV BOLUS
INTRAVENOUS | Status: DC | PRN
  Administered 2019-12-22: 150 mg via INTRAVENOUS

## 2019-12-22 MED ORDER — BUPIVACAINE-EPINEPHRINE (PF) 0.5% -1:200000 IJ SOLN
INTRAMUSCULAR | Status: DC | PRN
  Administered 2019-12-22: 30 mL via PERINEURAL

## 2019-12-22 SURGICAL SUPPLY — 32 items
BLADE INCISOR PLUS 4.5 (BLADE) IMPLANT
BNDG ELASTIC 4X5.8 VLCR STR LF (GAUZE/BANDAGES/DRESSINGS) IMPLANT
CHLORAPREP W/TINT 26 (MISCELLANEOUS) ×3 IMPLANT
COVER WAND RF STERILE (DRAPES) ×3 IMPLANT
CUFF TOURN SGL QUICK 24 (TOURNIQUET CUFF)
CUFF TOURN SGL QUICK 30 (TOURNIQUET CUFF)
CUFF TRNQT CYL 24X4X16.5-23 (TOURNIQUET CUFF) IMPLANT
CUFF TRNQT CYL 30X4X21-28X (TOURNIQUET CUFF) IMPLANT
GAUZE SPONGE 4X4 12PLY STRL (GAUZE/BANDAGES/DRESSINGS) ×3 IMPLANT
GLOVE SURG SYN 9.0  PF PI (GLOVE) ×1
GLOVE SURG SYN 9.0 PF PI (GLOVE) ×2 IMPLANT
GOWN SRG 2XL LVL 4 RGLN SLV (GOWNS) ×2 IMPLANT
GOWN STRL NON-REIN 2XL LVL4 (GOWNS) ×1
GOWN STRL REUS W/ TWL LRG LVL3 (GOWN DISPOSABLE) ×4 IMPLANT
GOWN STRL REUS W/TWL LRG LVL3 (GOWN DISPOSABLE) ×2
GRAFT FILLER BONE 5ML (Knees) IMPLANT
IV LACTATED RINGER IRRG 3000ML (IV SOLUTION) ×2
IV LR IRRIG 3000ML ARTHROMATIC (IV SOLUTION) ×4 IMPLANT
KIT ACCUFILL 5CC (Knees) ×2 IMPLANT
KIT KNEE SCP 414.502 (Knees) ×3 IMPLANT
KIT TURNOVER KIT A (KITS) ×3 IMPLANT
MANIFOLD NEPTUNE II (INSTRUMENTS) ×3 IMPLANT
NEEDLE HYPO 22GX1.5 SAFETY (NEEDLE) ×3 IMPLANT
PACK ARTHROSCOPY KNEE (MISCELLANEOUS) ×3 IMPLANT
SCALPEL PROTECTED #11 DISP (BLADE) ×3 IMPLANT
SET TUBE SUCT SHAVER OUTFL 24K (TUBING) ×3 IMPLANT
SET TUBE TIP INTRA-ARTICULAR (MISCELLANEOUS) ×3 IMPLANT
SUT ETHILON 4-0 (SUTURE) ×1
SUT ETHILON 4-0 FS2 18XMFL BLK (SUTURE) ×2
SUTURE ETHLN 4-0 FS2 18XMF BLK (SUTURE) ×2 IMPLANT
TUBING ARTHRO INFLOW-ONLY STRL (TUBING) ×3 IMPLANT
WAND COBLATION FLOW 50 (SURGICAL WAND) ×3 IMPLANT

## 2019-12-22 NOTE — Progress Notes (Signed)
Patient educated and demonstrated use of incentive spirometer with a goal of .

## 2019-12-22 NOTE — Discharge Instructions (Addendum)
Take it easy through the weekend.  Okay to walk around the house but try not to do too much activity.  Do not worry about bending the knee while the current bandages in place. Pain medicine as directed. Aspirin 325 mg daily until walking normally. Keep dressing clean and dry.  Okay to put ice around the knee and best to keep the foot elevated until return visit is much as possible.    AMBULATORY SURGERY  DISCHARGE INSTRUCTIONS   1) The drugs that you were given will stay in your system until tomorrow so for the next 24 hours you should not:  A) Drive an automobile B) Make any legal decisions C) Drink any alcoholic beverage   2) You may resume regular meals tomorrow.  Today it is better to start with liquids and gradually work up to solid foods.  You may eat anything you prefer, but it is better to start with liquids, then soup and crackers, and gradually work up to solid foods.   3) Please notify your doctor immediately if you have any unusual bleeding, trouble breathing, redness and pain at the surgery site, drainage, fever, or pain not relieved by medication.    4) Additional Instructions:        Please contact your physician with any problems or Same Day Surgery at [phone], Monday through Friday 6 am to 4 pm, or [place] at [facility] number at [phone].

## 2019-12-22 NOTE — Anesthesia Preprocedure Evaluation (Addendum)
Anesthesia Evaluation  Patient identified by MRN, date of birth, ID band Patient awake    Reviewed: Allergy & Precautions, H&P , NPO status , Patient's Chart, lab work & pertinent test results  Airway Mallampati: II  TM Distance: >3 FB Neck ROM: full    Dental  (+) Teeth Intact   Pulmonary neg pulmonary ROS,    breath sounds clear to auscultation       Cardiovascular hypertension,  Rhythm:regular Rate:Normal     Neuro/Psych PSYCHIATRIC DISORDERS Depression negative neurological ROS     GI/Hepatic Neg liver ROS, GERD  Controlled,  Endo/Other  Hypothyroidism Morbid obesity  Renal/GU      Musculoskeletal   Abdominal   Peds  Hematology negative hematology ROS (+)   Anesthesia Other Findings Past Medical History: No date: Arthritis No date: Endometrial polyp No date: GERD (gastroesophageal reflux disease) No date: Hypertension No date: Hypothyroidism No date: Mild depression (HCC) No date: Plantar fasciitis No date: Tendonitis No date: Tendonitis  Past Surgical History: No date: ENDOMETRIAL BIOPSY No date: HYSTEROSCOPY No date: TUBAL LIGATION No date: WISDOM TOOTH EXTRACTION     Reproductive/Obstetrics negative OB ROS                           Anesthesia Physical Anesthesia Plan  ASA: III  Anesthesia Plan: General ETT   Post-op Pain Management:    Induction:   PONV Risk Score and Plan: Ondansetron, Dexamethasone, Midazolam and Treatment may vary due to age or medical condition  Airway Management Planned:   Additional Equipment:   Intra-op Plan:   Post-operative Plan:   Informed Consent: I have reviewed the patients History and Physical, chart, labs and discussed the procedure including the risks, benefits and alternatives for the proposed anesthesia with the patient or authorized representative who has indicated his/her understanding and acceptance.     Dental  Advisory Given  Plan Discussed with: Anesthesiologist  Anesthesia Plan Comments:        Anesthesia Quick Evaluation

## 2019-12-22 NOTE — Anesthesia Procedure Notes (Signed)
Procedure Name: Intubation Date/Time: 12/22/2019 9:58 AM Performed by: Nelda Marseille, CRNA Pre-anesthesia Checklist: Patient identified, Patient being monitored, Timeout performed, Emergency Drugs available and Suction available Patient Re-evaluated:Patient Re-evaluated prior to induction Oxygen Delivery Method: Circle system utilized Preoxygenation: Pre-oxygenation with 100% oxygen Induction Type: IV induction Ventilation: Mask ventilation without difficulty Laryngoscope Size: Mac, 3 and McGraph Grade View: Grade III Tube type: Oral Tube size: 7.0 mm Number of attempts: 1 Airway Equipment and Method: Stylet Placement Confirmation: ETT inserted through vocal cords under direct vision,  positive ETCO2 and breath sounds checked- equal and bilateral Secured at: 21 cm Tube secured with: Tape Dental Injury: Teeth and Oropharynx as per pre-operative assessment

## 2019-12-22 NOTE — Op Note (Signed)
12/22/2019  11:08 AM  PATIENT:  Brittany Rocha  64 y.o. female  PRE-OPERATIVE DIAGNOSIS:  Complex tear of medial meniscus of right knee Insufficiency fracture of femur  POST-OPERATIVE DIAGNOSIS: Medial plica band right knee Insufficiency fracture of femur  PROCEDURE:  Procedure(s): MEDIAL FEMORAL SUBCHONDROPLASTY (Right) KNEE ARTHROSCOPY WITH EXCISION PLICA  SURGEON: Leitha Schuller, MD  ASSISTANTS: None  ANESTHESIA:   general  EBL:  Total I/O In: 700 [I.V.:700] Out: 20 [Blood:20]  BLOOD ADMINISTERED:none  DRAINS: none   LOCAL MEDICATIONS USED:  MARCAINE     SPECIMEN:  No Specimen  DISPOSITION OF SPECIMEN:  N/A  COUNTS:  YES  TOURNIQUET: 38 minutes at 300 mmHg  IMPLANTS: Bone substitute in the medial femoral condyle  DICTATION: .Dragon Dictation patient was brought to the operating room and after adequate anesthesia was obtained the right leg was prepped and draped in usual sterile fashion.  Tourniquet was applied to the upper thigh.  After prepping and draping in the usual sterile fashion appropriate patient identification and timeout procedures were completed.  An inferior lateral portal was made and the arthroscope introduced showing mild patellofemoral degenerative change with a large medial plica impinging on the joint.  Come around the medial compartment and inferior medial portal was made and there was some articular cartilage defects to the femoral condyle and tibia with fibrillation but no exposed bone and no fissuring down to the bone.  The meniscus MRI had shown probable tear but this was not identified intraoperatively.  The meniscus was left alone ACL was intact.  Lateral compartment had some fibrillation to the articular cartilage but meniscus was intact.  The gutters were free of any loose bodies.  The plica band was ablated and the arthroscope removed.  C arm was brought in and with AP and lateral visualization a small incision made and trocar advanced into the  medial femoral condyle and the area of the bone lesion.  The bone substitute was mixed and then injected under fluoroscopic guidance.  It appeared to be entirely contained within the defect and area of the femoral condyle with the lesion.  After allowing this to set completely the trocar was removed and permanent C arm views obtained.  Next the repeat arthroscopy was carried out and there was no extravasation of the bone substitute into the joint.  The wound was were irrigated and then closed with simple interrupted 4-0 nylon with a total of 30 cc of Marcaine injected 10 cc to each both each portal and the trocar site on the medial femoral condyle.  Dressings of Xeroform 4 x 4 web roll and Ace wrap were applied with tourniquet let down before application of dressing.  PLAN OF CARE: Discharge to home after PACU  PATIENT DISPOSITION:  PACU - hemodynamically stable.

## 2019-12-22 NOTE — H&P (Signed)
Chief Complaint  Patient presents with  . Follow-up  Rt Knee MMT and OA   History of the Present Illness: Brittany Rocha is a 64 y.o. female here for evaluation of right knee arthritis and extensive complex meniscus tear, as well as a bone bruise to the medial femoral condyle. She comes in today to discuss MRI results.  The patient locates her pain to the medial and posterior aspects of her right knee. She relays she has had pain since 08/2019. The patient states she has been favoring her right leg. She has tried creams to ease the pain without relief. The patient has had a right knee injection with approximately 1 week's worth of relief.   The patient also has early arthritis.   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 She also has a part-time job at [employer] 3 days a week, 4 hours a day.   I have reviewed past medical, surgical, social and family history, and allergies as documented in the EMR.  Past Medical History: Past Medical History:  Diagnosis Date  . Acquired hypothyroidism, unspecified  . Borderline diabetes  . Essential hypertension  . GERD without esophagitis  . Obesity   Past Surgical History: Past Surgical History:  Procedure Laterality Date  . COLONOSCOPY 10/13/12  nl per Dr. Bary Castilla (repeat 10 yrs)  . DILATION AND CURETTAGE, DIAGNOSTIC / THERAPEUTIC   Past Family History: Family History  Problem Relation Age of Onset  . Cancer Mother  . COPD Father  . No Known Problems Sister  . No Known Problems Brother   Medications: Current Outpatient Medications Ordered in Epic  Medication Sig Dispense Refill  . acetaminophen (TYLENOL) 650 MG ER tablet Take 650 mg by mouth every 8 (eight) hours as needed for Pain ARTHRITIS  . amLODIPine (NORVASC) 5 MG tablet TAKE 1 TABLET DAILY 90 tablet 3  . EPINEPHrine (EPIPEN) 0.3 mg/0.3 mL pen injector Inject 0.3 mg into the muscle once as needed for Anaphylaxis.  . fluticasone  (FLONASE) 50 mcg/actuation nasal spray Place 2 sprays into both nostrils once daily as needed.  . fluticasone-salmeterol (ADVAIR DISKUS) 250-50 mcg/dose diskus inhaler Inhale 1 inhalation into the lungs once daily as needed.  . hydroCHLOROthiazide (HYDRODIURIL) 25 MG tablet TAKE ONE-HALF (1/2) TABLET ONCE DAILY 45 tablet 4  . levothyroxine (SYNTHROID) 100 MCG tablet TAKE 1 TABLET DAILY. TAKE ON AN EMPTY STOMACH WITH A GLASS OF WATER AT LEAST 30 TO 60 MINUTES BEFORE BREAKFAST 90 tablet 4  . meloxicam (MOBIC) 15 MG tablet Take 1 tablet by mouth once daily  . traMADoL (ULTRAM) 50 mg tablet Take 1 tablet by mouth once daily   No current Epic-ordered facility-administered medications on file.   Allergies: Allergies  Allergen Reactions  . Bee Pollen Other (See Comments)  Sneezing  . Mite Extract Other (See Comments)  Sneezing  . Other Other (See Comments)    Body mass index is 43.26 kg/m.  Review of Systems: A comprehensive 14 point ROS was performed, reviewed, and the pertinent orthopaedic findings are documented in the HPI.  Vitals:  12/02/19 0825  BP: 138/84   General Physical Examination:  General/Constitutional: No apparent distress: well-nourished and well developed. Eyes: Pupils equal, round with synchronous movement. Lungs: Clear to auscultation HEENT: Normal Vascular: No edema, swelling or tenderness, except as noted in detailed exam. Cardiac: Heart rate and rhythm is regular. Integumentary: No impressive skin lesions present, except as noted in detailed exam. Neuro/Psych:  Normal mood and affect, oriented to person, place and time.  Musculoskeletal Examination: On exam, painful Baker cyst to the right leg. Pain with right leg extension. Right knee flexion to 110 with pain. Tender along the medial joint line. No crepitation. Right hip internal rotation to 30 degrees, external to 60 degrees.   Radiographs: No new imaging studies were obtained or reviewed  today.  Assessment: ICD-10-CM  1. Complex tear of medial meniscus of right knee as current injury, subsequent encounter S83.231D  2. Insufficiency fracture of femur, right, initial encounter (CMS-HCC) M84.451A   Plan: The patient has clinical findings of right knee complex meniscal tear and bone bruise to the medial femoral condyle.   We discussed the patient's MRI results. It is reasonable to do a right knee arthroscopy, and I also explained subchondroplasty in detail to the patient. I shared the [employer] video on meniscal tears and arthroscopy with her.   Scribe Attestation: I, Dawn Royse, am acting as scribe for [employer], MD    Electronically signed by Marlena Clipper, MD at 12/04/2019 8:41 AM EDT  Back to top of Progress Notes Harrel Carina, CMA - 12/02/2019 8:15 AM EST Review of Systems  Constitutional: Negative.  HENT: Negative.  Eyes: Negative.  Respiratory: Negative.  Gastrointestinal: Negative.  Endocrine: Negative.  Genitourinary: Negative.  Musculoskeletal: Positive for arthralgias, gait problem and joint swelling.  Skin: Negative.  Allergic/Immunologic: Negative.  Hematological: Negative.  Psychiatric/Behavioral: Negative.    Electronically signed by Harrel Carina, CMA at 12/04/2019 8:41 AM EDT   Reviewed paper H+P, will be scanned into chart. No changes noted.

## 2019-12-22 NOTE — Transfer of Care (Signed)
Immediate Anesthesia Transfer of Care Note  Patient: Brittany Rocha  Procedure(s) Performed: MEDIAL FEMORAL SUBCHONDROPLASTY (Right Knee) KNEE ARTHROSCOPY WITH EXCISION PLICA (Knee)  Patient Location: PACU  Anesthesia Type:General  Level of Consciousness: drowsy  Airway & Oxygen Therapy: Patient Spontanous Breathing and Patient connected to face mask oxygen  Post-op Assessment: Report given to RN and Post -op Vital signs reviewed and stable  Post vital signs: Reviewed  Last Vitals:  Vitals Value Taken Time  BP 114/80 12/22/19 1107  Temp    Pulse 85 12/22/19 1108  Resp 21 12/22/19 1108  SpO2 100 % 12/22/19 1108  Vitals shown include unvalidated device data.  Last Pain:  Vitals:   12/22/19 0851  TempSrc: Temporal  PainSc: 8          Complications: No apparent anesthesia complications

## 2019-12-23 NOTE — Anesthesia Postprocedure Evaluation (Signed)
Anesthesia Post Note  Patient: Brittany Rocha  Procedure(s) Performed: MEDIAL FEMORAL SUBCHONDROPLASTY (Right Knee) KNEE ARTHROSCOPY WITH EXCISION PLICA (Knee)  Patient location during evaluation: PACU Anesthesia Type: General Level of consciousness: awake and alert Pain management: pain level controlled Vital Signs Assessment: post-procedure vital signs reviewed and stable Respiratory status: spontaneous breathing, nonlabored ventilation and respiratory function stable Cardiovascular status: blood pressure returned to baseline and stable Postop Assessment: no apparent nausea or vomiting Anesthetic complications: no     Last Vitals:  Vitals:   12/22/19 1235 12/22/19 1509  BP: (!) 148/98 (!) 147/82  Pulse: 79 86  Resp: 20 (!) 22  Temp: (!) 36.1 C   SpO2: 97% 96%    Last Pain:  Vitals:   12/22/19 1235  TempSrc: Temporal  PainSc: 5                  Karleen Hampshire

## 2019-12-27 ENCOUNTER — Ambulatory Visit: Payer: BC Managed Care – PPO | Admitting: Podiatry

## 2020-01-20 ENCOUNTER — Ambulatory Visit: Payer: BC Managed Care – PPO | Attending: Internal Medicine

## 2020-01-20 DIAGNOSIS — Z23 Encounter for immunization: Secondary | ICD-10-CM

## 2020-01-20 NOTE — Progress Notes (Signed)
   Covid-19 Vaccination Clinic  Name:  Brittany Rocha    MRN: [id number] DOB: 1956-04-15  01/20/2020  [name] was observed post Covid-19 immunization for 15 minutes without incident. She was provided with Vaccine Information Sheet and instruction to access the V-Safe system.   [name] was instructed to call 911 with any severe reactions post vaccine: Marland Kitchen Difficulty breathing  . Swelling of face and throat  . A fast heartbeat  . A bad rash all over body  . Dizziness and weakness   Immunizations Administered    Name Date Dose VIS Date Route   Pfizer COVID-19 Vaccine 01/20/2020  9:38 AM 0.3 mL 11/16/2018 Intramuscular   Manufacturer: [employer], [employer]   Lot: [id number]   NDC: [address]-0

## 2020-01-24 ENCOUNTER — Other Ambulatory Visit: Payer: Self-pay

## 2020-01-24 ENCOUNTER — Ambulatory Visit: Payer: BC Managed Care – PPO | Admitting: Podiatry

## 2020-01-24 DIAGNOSIS — B351 Tinea unguium: Secondary | ICD-10-CM | POA: Diagnosis not present

## 2020-01-24 DIAGNOSIS — L989 Disorder of the skin and subcutaneous tissue, unspecified: Secondary | ICD-10-CM | POA: Diagnosis not present

## 2020-01-24 DIAGNOSIS — M79676 Pain in unspecified toe(s): Secondary | ICD-10-CM

## 2020-01-31 NOTE — Progress Notes (Signed)
    Subjective: Patient is a 64 y.o. female presenting to the office today for follow up evaluation of painful callus lesion(s) noted to the right 2nd toe. Walking and wearing shoes increases the pain. She has not done anything at home for treatment.  Patient also complains of elongated, thickened nails that cause pain while ambulating in shoes. She is unable to trim her own nails. Patient presents today for further treatment and evaluation.  Past Medical History:  Diagnosis Date  . Arthritis   . Endometrial polyp   . GERD (gastroesophageal reflux disease)   . Hypertension   . Hypothyroidism   . Mild depression (HCC)   . Plantar fasciitis   . Tendonitis   . Tendonitis     Objective:  Physical Exam General: Alert and oriented x3 in no acute distress  Dermatology: Hyperkeratotic lesion(s) present on the right 2nd toe. Pain on palpation with a central nucleated core noted. Skin is warm, dry and supple bilateral lower extremities. Negative for open lesions or macerations. Nails are tender, long, thickened and dystrophic with subungual debris, consistent with onychomycosis, 1-5 bilateral. No signs of infection noted.  Vascular: Palpable pedal pulses bilaterally. No edema or erythema noted. Capillary refill within normal limits.  Neurological: Epicritic and protective threshold grossly intact bilaterally.   Musculoskeletal Exam: Pain on palpation at the keratotic lesion(s) noted. Range of motion within normal limits bilateral. Muscle strength 5/5 in all groups bilateral.  Assessment: 1. Onychodystrophic nails 1-5 bilateral with hyperkeratosis of nails.  2. Onychomycosis of nail due to dermatophyte bilateral 3. Pre-ulcerative callus lesion noted to the right 2nd toe    Plan of Care:  1. Patient evaluated. 2. Excisional debridement of keratoic lesion(s) using a chisel blade was performed without incident.  3. Dressed with light dressing. 4. Mechanical debridement of nails 1-5  bilaterally performed using a nail nipper. Filed with dremel without incident.  5. Patient is to return to the clinic in 3 months.   Patient had right knee surgery in April 2021.   Felecia Shelling, DPM [facility]  Dr. Felecia Shelling, DPM    [address]                                        [place], [place] [address]                Office [phone]  Fax [phone]

## 2020-02-14 ENCOUNTER — Ambulatory Visit: Payer: BC Managed Care – PPO | Attending: Internal Medicine

## 2020-02-14 DIAGNOSIS — Z23 Encounter for immunization: Secondary | ICD-10-CM

## 2020-02-14 NOTE — Progress Notes (Signed)
   Covid-19 Vaccination Clinic  Name:  Kash Mothershead    MRN: [id number] DOB: 1956-07-17  02/14/2020  Ms. Martenson was observed post Covid-19 immunization for 15 minutes without incident. She was provided with Vaccine Information Sheet and instruction to access the V-Safe system.   Ms. Zimny was instructed to call 911 with any severe reactions post vaccine: Marland Kitchen Difficulty breathing  . Swelling of face and throat  . A fast heartbeat  . A bad rash all over body  . Dizziness and weakness   Immunizations Administered    Name Date Dose VIS Date Route   Pfizer COVID-19 Vaccine 02/14/2020  9:30 AM 0.3 mL 11/16/2018 Intramuscular   Manufacturer: [employer], [employer]   Lot: [id number]   NDC: [address]-4

## 2020-05-01 ENCOUNTER — Ambulatory Visit: Payer: BC Managed Care – PPO | Admitting: Podiatry

## 2020-07-11 ENCOUNTER — Other Ambulatory Visit: Payer: Self-pay | Admitting: Obstetrics & Gynecology

## 2020-07-11 DIAGNOSIS — Z1231 Encounter for screening mammogram for malignant neoplasm of breast: Secondary | ICD-10-CM

## 2020-08-29 ENCOUNTER — Other Ambulatory Visit: Payer: Self-pay | Admitting: Orthopedic Surgery

## 2020-08-29 DIAGNOSIS — M5440 Lumbago with sciatica, unspecified side: Secondary | ICD-10-CM

## 2020-09-06 ENCOUNTER — Other Ambulatory Visit: Payer: Self-pay

## 2020-09-06 ENCOUNTER — Ambulatory Visit
Admission: RE | Admit: 2020-09-06 | Discharge: 2020-09-06 | Disposition: A | Discharge status: Home / Self Care | Payer: BC Managed Care – PPO | Source: Ambulatory Visit | Attending: Orthopedic Surgery | Admitting: Orthopedic Surgery

## 2020-09-06 DIAGNOSIS — M5440 Lumbago with sciatica, unspecified side: Secondary | ICD-10-CM | POA: Diagnosis present

## 2020-09-12 ENCOUNTER — Other Ambulatory Visit: Payer: Self-pay

## 2020-09-12 ENCOUNTER — Encounter: Payer: Self-pay | Admitting: Obstetrics & Gynecology

## 2020-09-12 ENCOUNTER — Ambulatory Visit (INDEPENDENT_AMBULATORY_CARE_PROVIDER_SITE_OTHER): Payer: BC Managed Care – PPO | Admitting: Obstetrics & Gynecology

## 2020-09-12 VITALS — BP 130/90 | Ht 66.0 in | Wt 243.0 lb

## 2020-09-12 DIAGNOSIS — Z1211 Encounter for screening for malignant neoplasm of colon: Secondary | ICD-10-CM

## 2020-09-12 DIAGNOSIS — Z01419 Encounter for gynecological examination (general) (routine) without abnormal findings: Secondary | ICD-10-CM

## 2020-09-12 DIAGNOSIS — Z8041 Family history of malignant neoplasm of ovary: Secondary | ICD-10-CM

## 2020-09-12 NOTE — Patient Instructions (Signed)
PAP every three years Mammogram every year    Call [phone] to schedule at [facility] Colonoscopy every 10 years Labs yearly (with PCP)  Thank you for choosing [facility]. As part of our ongoing efforts to improve patient experience, we would appreciate your feedback. Please fill out the short survey that you will receive by mail or MyChart. Your opinion is important to us! - Dr. Brandan Glauber  Recommendations to boost your immunity to prevent illness such as viral flu and colds, including covid19, are as follows:       - - -  Vitamin K2 and Vitamin D3  - - - Take Vitamin K2 at 200-300 mcg daily (usually 2-3 pills daily of the over the counter formulation). Take Vitamin D3 at 3000-4000 U daily (usually 3-4 pills daily of the over the counter formulation). Studies show that these two at high normal levels in your system are very effective in keeping your immunity so strong and protective that you will be unlikely to contract viral illness such as those listed above.  Dr Devory Mckinzie  

## 2020-09-12 NOTE — Progress Notes (Signed)
HPI:      [name] is a 64 y.o. G2P2002 who LMP was in the past, she presents today for her annual examination.  The patient has no complaints today. The patient is not currently sexually active. Herlast pap: approximate date 2020 and was normal and last mammogram: approximate date 2021 and was normal.  The patient does perform self breast exams.  There is no notable family history of breast or ovarian cancer in her family. The patient is not taking hormone replacement therapy. Patient denies post-menopausal vaginal bleeding.   The patient has regular exercise: yes. The patient denies current symptoms of depression.    GYN Hx: Last Colonoscopy:7-8 years ago. Normal.  Last DEXA: never ago.    PMHx: Past Medical History:  Diagnosis Date   Arthritis    Endometrial polyp    GERD (gastroesophageal reflux disease)    Hypertension    Hypothyroidism    Mild depression (HCC)    Plantar fasciitis    Tendonitis    Tendonitis    Past Surgical History:  Procedure Laterality Date   ENDOMETRIAL BIOPSY     HYSTEROSCOPY     KNEE ARTHROSCOPY WITH EXCISION PLICA  12/22/2019   Procedure: KNEE ARTHROSCOPY WITH EXCISION PLICA;  Surgeon: Kennedy Bucker, MD;  Location: [facility] ORS;  Service: Orthopedics;;   KNEE ARTHROSCOPY WITH MEDIAL MENISECTOMY Right 12/22/2019   Procedure: MEDIAL FEMORAL SUBCHONDROPLASTY;  Surgeon: Kennedy Bucker, MD;  Location: [facility] ORS;  Service: Orthopedics;  Laterality: Right;   TUBAL LIGATION     WISDOM TOOTH EXTRACTION     Family History  Problem Relation Age of Onset   Cancer Mother 69       ovarian   Hypertension Father    Lung cancer Father    Stomach cancer Maternal Aunt 28   Breast cancer Neg Hx    Social History   Tobacco Use   Smoking status: Never Smoker   Smokeless tobacco: Never Used  Vaping Use   Vaping Use: Never used  Substance Use Topics   Alcohol use: No   Drug use: No    Current Outpatient Medications:    amLODipine  (NORVASC) 5 MG tablet, Take 5 mg by mouth every morning. , Disp: , Rfl:    hydrochlorothiazide (HYDRODIURIL) 25 MG tablet, Take 25 mg by mouth daily. , Disp: , Rfl:    levothyroxine (SYNTHROID, LEVOTHROID) 100 MCG tablet, Take 100 mcg by mouth daily before breakfast. , Disp: , Rfl:    omeprazole (PRILOSEC) 20 MG capsule, Take 20 mg by mouth daily as needed (acid reflux/indigestion.). , Disp: , Rfl:    HYDROcodone-acetaminophen (NORCO) 5-325 MG tablet, Take 1-2 tablets by mouth every 6 (six) hours as needed for moderate pain. (Patient not taking: Reported on 09/12/2020), Disp: 30 tablet, Rfl: 0   meloxicam (MOBIC) 15 MG tablet, Take 15 mg by mouth daily.  (Patient not taking: Reported on 09/12/2020), Disp: , Rfl:   Current Facility-Administered Medications:    betamethasone acetate-betamethasone sodium phosphate (CELESTONE) injection 3 mg, 3 mg, Intramuscular, Once, Evans, Brent M, DPM   betamethasone acetate-betamethasone sodium phosphate (CELESTONE) injection 3 mg, 3 mg, Intramuscular, Once, Evans, Brent M, DPM Allergies: Dust mite extract, Pollen extract, and Tree extract  Review of Systems  Constitutional: Negative for chills, fever and malaise/fatigue.  HENT: Negative for congestion, sinus pain and sore throat.   Eyes: Negative for blurred vision and pain.  Respiratory: Negative for cough and wheezing.   Cardiovascular: Negative for chest pain and leg  swelling.  Gastrointestinal: Negative for abdominal pain, constipation, diarrhea, heartburn, nausea and vomiting.  Genitourinary: Negative for dysuria, frequency, hematuria and urgency.  Musculoskeletal: Negative for back pain, joint pain, myalgias and neck pain.  Skin: Negative for itching and rash.  Neurological: Negative for dizziness, tremors and weakness.  Endo/Heme/Allergies: Does not bruise/bleed easily.  Psychiatric/Behavioral: Negative for depression. The patient is not nervous/anxious and does not have insomnia.      Objective: BP 130/90    Ht 5\' 6"  (1.676 m)    Wt 243 lb (110.2 kg)    BMI 39.22 kg/m   Filed Weights   09/12/20 0855  Weight: 243 lb (110.2 kg)   Body mass index is 39.22 kg/m. Physical Exam Constitutional:      General: She is not in acute distress.    Appearance: She is well-developed and well-nourished.  Genitourinary:     Vagina, uterus and rectum normal.     There is no rash or lesion on the right labia.     There is no rash or lesion on the left labia.    No lesions in the vagina.     No vaginal bleeding.      Right Adnexa: not tender and no mass present.    Left Adnexa: not tender and no mass present.    No cervical motion tenderness, friability, lesion or polyp.     Uterus is mobile.     Uterus is not enlarged.     No uterine mass detected.    Uterus is midaxial.     Pelvic exam was performed with patient supine.  Breasts:     Right: No mass, skin change or tenderness.     Left: No mass, skin change or tenderness.    HENT:     Head: Normocephalic and atraumatic. No laceration.     Right Ear: Hearing normal.     Left Ear: Hearing normal.     Nose: No epistaxis or foreign body.     Mouth/Throat:     Mouth: Oropharynx is clear and moist and mucous membranes are normal.     Pharynx: Uvula midline.  Eyes:     Pupils: Pupils are equal, round, and reactive to light.  Neck:     Thyroid: No thyromegaly.  Cardiovascular:     Rate and Rhythm: Normal rate and regular rhythm.     Heart sounds: No murmur heard. No friction rub. No gallop.   Pulmonary:     Effort: Pulmonary effort is normal. No respiratory distress.     Breath sounds: Normal breath sounds. No wheezing.  Abdominal:     General: Bowel sounds are normal. There is no distension.     Palpations: Abdomen is soft.     Tenderness: There is no abdominal tenderness. There is no rebound.  Musculoskeletal:        General: Normal range of motion.     Cervical back: Normal range of motion and neck supple.   Neurological:     Mental Status: She is alert and oriented to person, place, and time.     Cranial Nerves: No cranial nerve deficit.  Skin:    General: Skin is warm and dry.  Psychiatric:        Mood and Affect: Mood and affect normal.        Judgment: Judgment normal.  Vitals reviewed.     Assessment: Annual Exam 1. Women's annual routine gynecological examination   2. Screen for colon cancer   3.  Family history of ovarian cancer     Plan:            1.  Cervical Screening-  Pap smear schedule reviewed with patient  2. Breast screening- Exam annually and mammogram scheduled  3. Colonoscopy every 10 years, Hemoccult testing after age 81  4. Labs managed by PCP  5. Counseling for hormonal therapy: none              6. FRAX - FRAX score for assessing the 10 year probability for fracture calculated and discussed today.  Based on age and score today, DEXA is not currently scheduled.  Rec next year   7. FH Ovarian cancer.  Options for gene testing discussed.     F/U  Return in about 1 year (around 09/12/2021) for Annual.  Annamarie Major, MD, [facility], [facility] 09/12/2020  9:38 AM

## 2020-11-28 ENCOUNTER — Other Ambulatory Visit: Payer: Self-pay

## 2020-11-28 ENCOUNTER — Ambulatory Visit
Admission: RE | Admit: 2020-11-28 | Discharge: 2020-11-28 | Disposition: A | Discharge status: Home / Self Care | Payer: BC Managed Care – PPO | Source: Ambulatory Visit | Attending: Obstetrics & Gynecology | Admitting: Obstetrics & Gynecology

## 2020-11-28 DIAGNOSIS — Z1231 Encounter for screening mammogram for malignant neoplasm of breast: Secondary | ICD-10-CM | POA: Insufficient documentation

## 2020-12-11 IMAGING — MG DIGITAL SCREENING BILAT W/ TOMO W/ CAD
8 series · 8 of 24 positions shown · non-contrast
Comparison: Previous exam(s).

CLINICAL DATA: Screening.

EXAM:
DIGITAL SCREENING BILATERAL MAMMOGRAM WITH TOMO AND CAD

[L CC synth-2D]
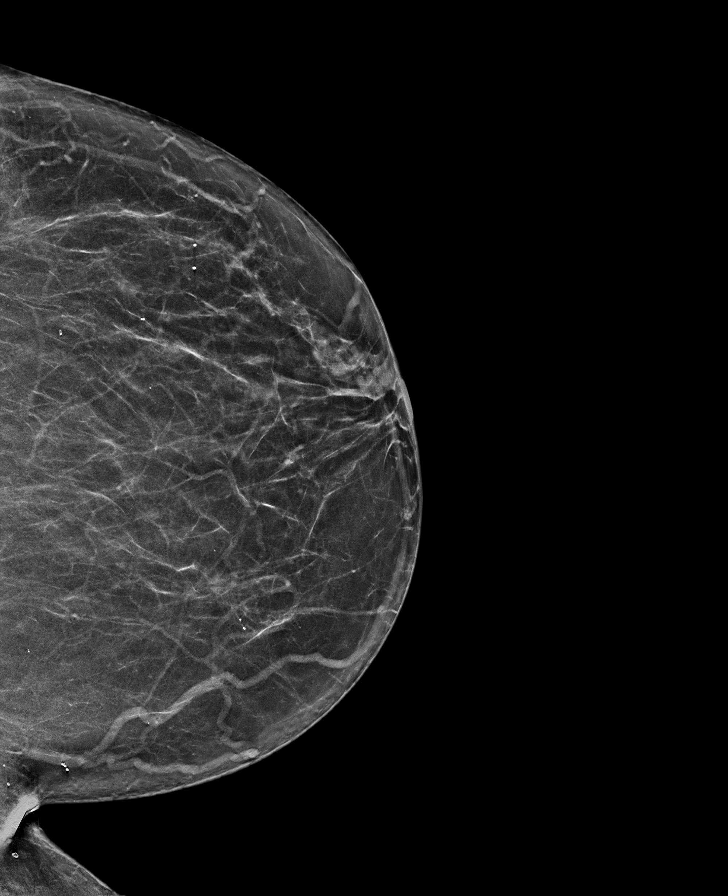

[R CC synth-2D]
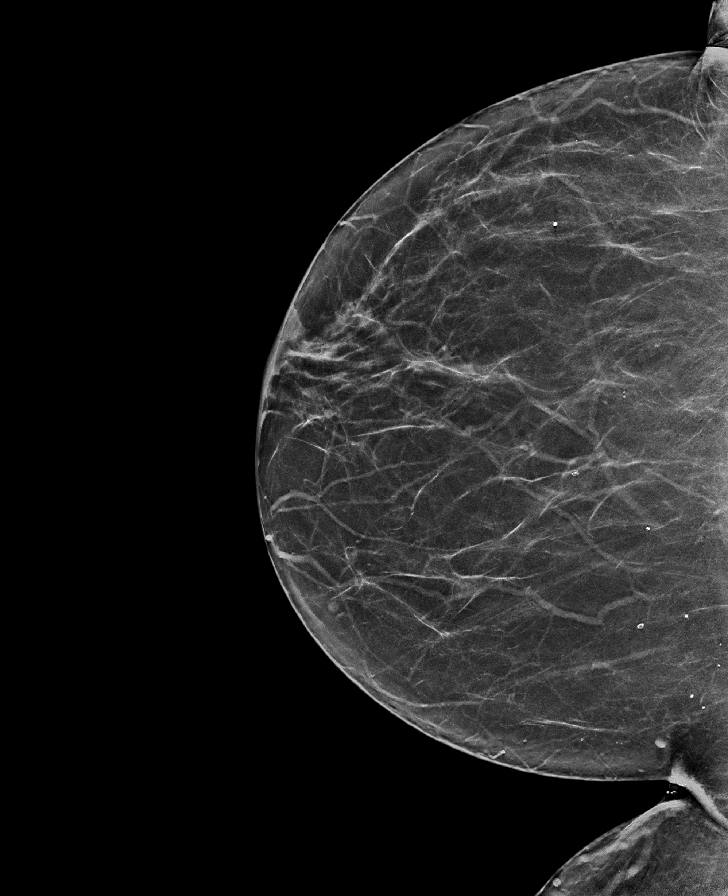

[R MLO synth-2D]
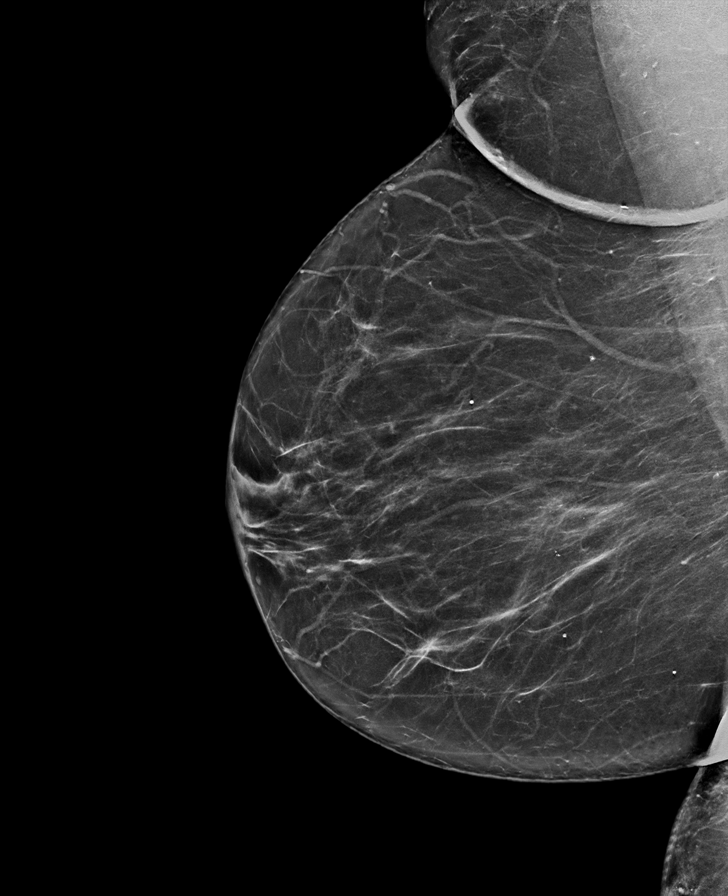

[L MLO synth-2D]
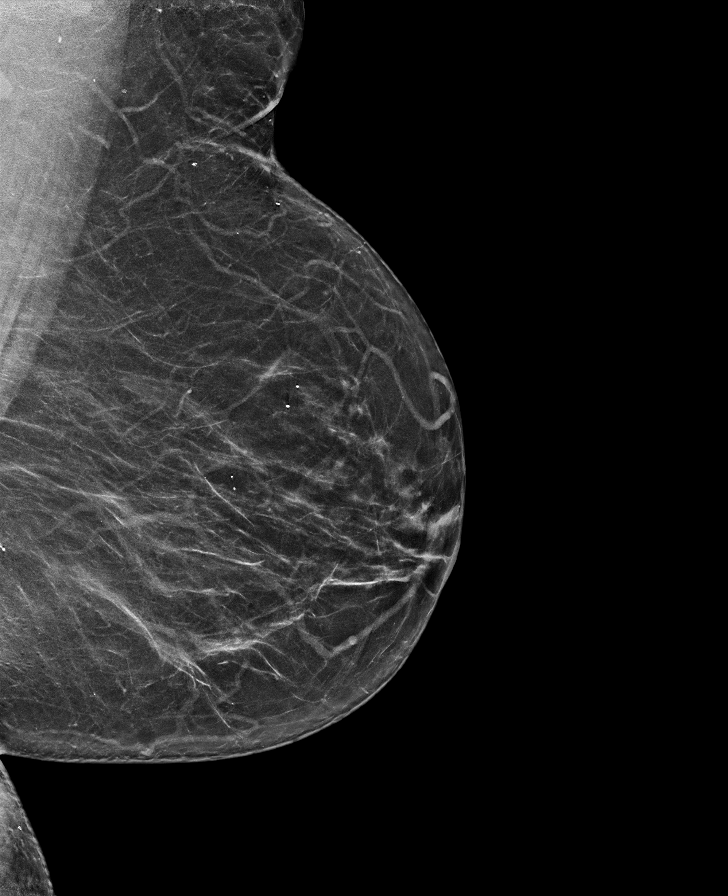

[R MLO tomo · tomo slice 37/73.0]
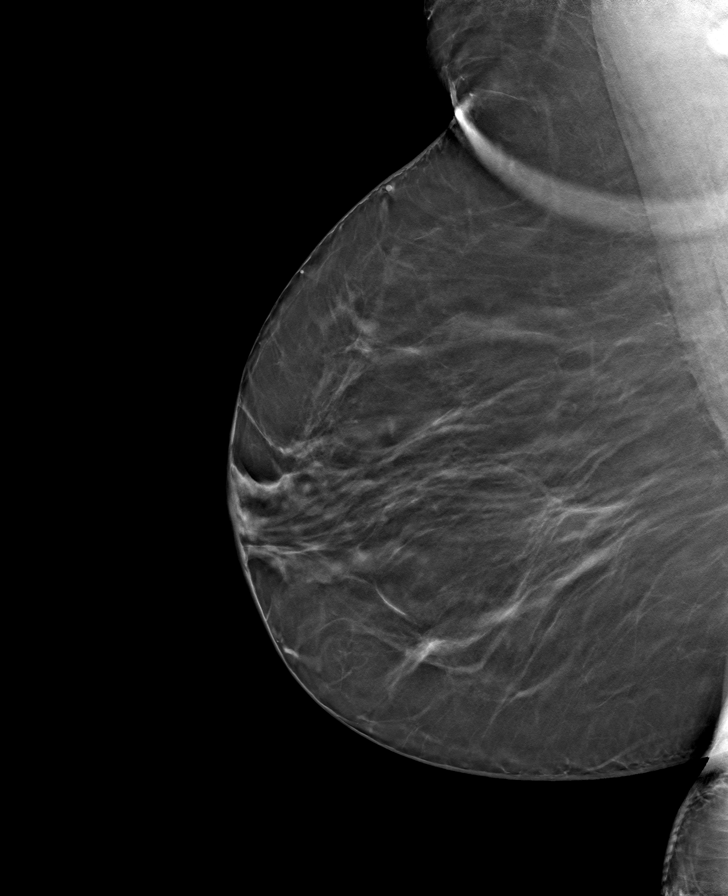

[R CC tomo · tomo slice 35/68.0]
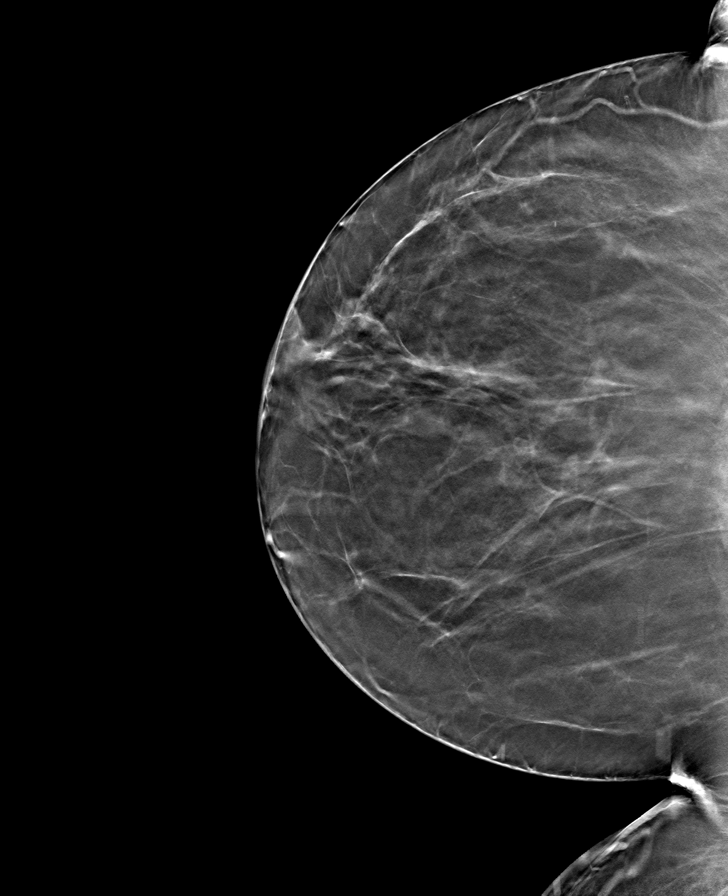

[L CC tomo · tomo slice 33/64.0]
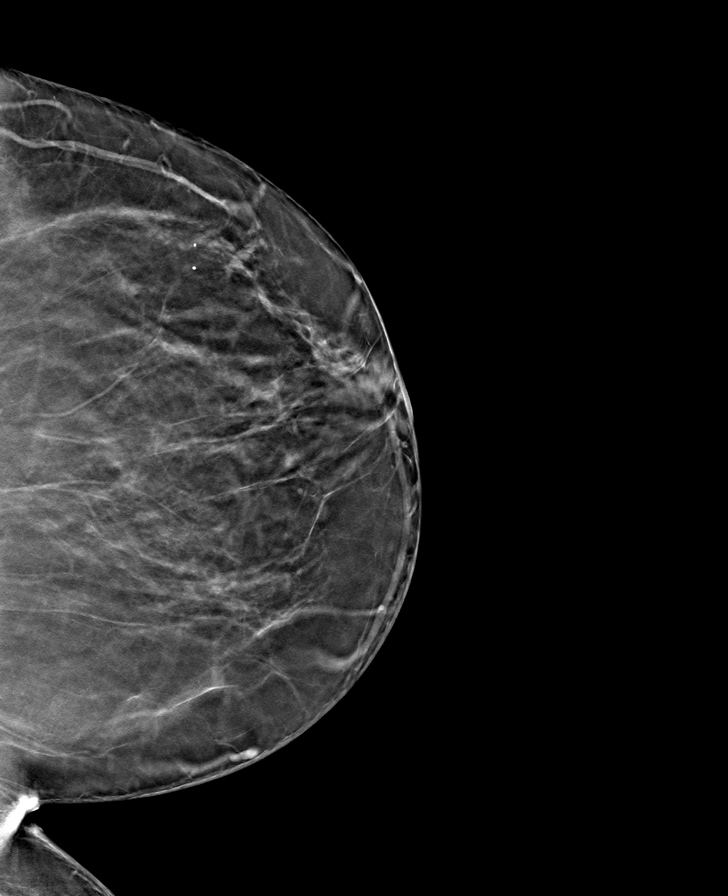

[L MLO tomo · tomo slice 35/68.0]
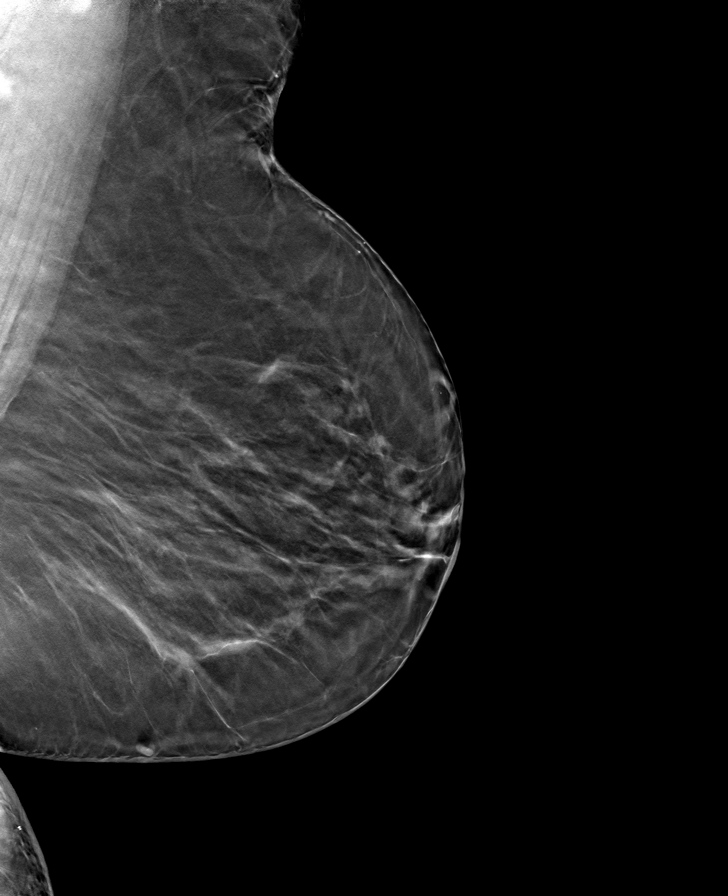

[8 of 24 positions shown; findings below may reference images not displayed]

ACR Breast Density Category b: There are scattered areas of
fibroglandular density.
FINDINGS: There are no findings suspicious for malignancy. Images were
processed with CAD.
IMPRESSION: No mammographic evidence of malignancy. A result letter of this
screening mammogram will be mailed directly to the patient.

RECOMMENDATION:
Screening mammogram in one year. (Code:[id number])

BI-RADS CATEGORY  1: Negative.

## 2021-03-22 ENCOUNTER — Ambulatory Visit: Payer: BC Managed Care – PPO | Admitting: Podiatry

## 2021-03-22 ENCOUNTER — Other Ambulatory Visit: Payer: Self-pay

## 2021-03-22 DIAGNOSIS — L600 Ingrowing nail: Secondary | ICD-10-CM

## 2021-03-22 NOTE — Patient Instructions (Signed)

## 2021-04-05 ENCOUNTER — Other Ambulatory Visit: Payer: Self-pay

## 2021-04-05 ENCOUNTER — Ambulatory Visit: Payer: BC Managed Care – PPO | Admitting: Podiatry

## 2021-04-05 DIAGNOSIS — L600 Ingrowing nail: Secondary | ICD-10-CM

## 2021-04-05 DIAGNOSIS — M722 Plantar fascial fibromatosis: Secondary | ICD-10-CM | POA: Diagnosis not present

## 2021-04-05 NOTE — Progress Notes (Signed)
   Subjective: 65 y.o. female presents today status post permanent nail avulsion procedure of the lateral border left great toe that was performed on 03/22/2021.  Patient states that she is doing well.  She has been soaking her foot and applying Neosporin as instructed.  No new complaints at this time  Past Medical History:  Diagnosis Date   Arthritis    Endometrial polyp    GERD (gastroesophageal reflux disease)    Hypertension    Hypothyroidism    Mild depression (HCC)    Plantar fasciitis    Tendonitis    Tendonitis     Objective: Skin is warm, dry and supple. Nail and respective nail fold appears to be healing appropriately. Open wound to the associated nail fold with a granular wound base and moderate amount of fibrotic tissue. Minimal drainage noted. Mild erythema around the periungual region likely due to phenol chemical matricectomy.  Assessment: #1 s/p partial permanent nail matrixectomy lateral border left great toe   Plan of care: #1 patient was evaluated  #2 light debridement of open wound was performed to the periungual border of the respective toe using a currette. Antibiotic ointment and Band-Aid was applied. #3  Today the patient was remolded for custom molded orthotics.  Apparently she was recast however the orthotics lab states that the cast molding was not deep enough.  Patient remolded today  #4 patient is to return for orthotics dispensable and pick up   Felecia Shelling, DPM [facility]  Dr. Felecia Shelling, DPM    [address] [place], [place] [address]                Office [phone]  Fax [phone]

## 2021-04-09 NOTE — Progress Notes (Signed)
   Subjective: Patient presents today for evaluation of pain to the lateral aspect of the left great toe. Patient is concerned for possible ingrown nail.  It is very sensitive to touch.  Patient presents today for further treatment and evaluation.  Past Medical History:  Diagnosis Date   Arthritis    Endometrial polyp    GERD (gastroesophageal reflux disease)    Hypertension    Hypothyroidism    Mild depression (HCC)    Plantar fasciitis    Tendonitis    Tendonitis     Objective:  General: Well developed, nourished, in no acute distress, alert and oriented x3   Dermatology: Skin is warm, dry and supple bilateral.  Lateral aspect of the left great toe  appears to be erythematous with evidence of an ingrowing nail. Pain on palpation noted to the border of the nail fold. The remaining nails appear unremarkable at this time. There are no open sores, lesions.  Vascular: Dorsalis Pedis artery and Posterior Tibial artery pedal pulses palpable. No lower extremity edema noted.   Neruologic: Grossly intact via light touch bilateral.  Musculoskeletal: Muscular strength within normal limits in all groups bilateral. Normal range of motion noted to all pedal and ankle joints.   Assesement: #1 Paronychia with ingrowing nail lateral aspect left great toe #2 Pain in toe  Plan of Care:  1. Patient evaluated.  2. Discussed treatment alternatives and plan of care. Explained nail avulsion procedure and post procedure course to patient. 3. Patient opted for permanent partial nail avulsion of the lateral aspect left great toe.  4. Prior to procedure, local anesthesia infiltration utilized using 3 ml of a 50:50 mixture of 2% plain lidocaine and 0.5% plain marcaine in a normal hallux block fashion and a betadine prep performed.  5. Partial permanent nail avulsion with chemical matrixectomy performed using 3x30sec applications of phenol followed by alcohol flush.  6. Light dressing applied.  Post care  instructions provided 7.  Prescription for gentamicin 2% cream  8.  Patient was also molded today for custom molded orthotics.   9.  Return to clinic 2-3 weeks for ingrown follow-up and to pick up orthotics.  Felecia Shelling, DPM [facility]  Dr. Felecia Shelling, DPM    [address] [place], [place] [address]                Office [phone]  Fax [phone]

## 2021-04-26 ENCOUNTER — Telehealth: Payer: Self-pay | Admitting: Podiatry

## 2021-04-26 NOTE — Telephone Encounter (Signed)
Orthotics in [place] office to be taken to [place].. lvm for pt to call to schedule an appt in [place] to pick them up.. next available 8.17 or 8.31 in [place] office.Marland Kitchen

## 2021-05-15 ENCOUNTER — Telehealth: Payer: Self-pay | Admitting: Podiatry

## 2021-05-15 NOTE — Telephone Encounter (Signed)
Pt called stating she was returning a call about her orthotics being in.. I was going to schedule pt but the days she needed there is no nurse schedule available. Pt has had orthotics before so is aware of how to wear them. She is just going to stop in and pick them up.. Could you please make sure the orthotic information form is given to the patient with the orthotics.

## 2021-12-09 ENCOUNTER — Other Ambulatory Visit: Payer: Self-pay | Admitting: Family Medicine

## 2021-12-09 DIAGNOSIS — Z1231 Encounter for screening mammogram for malignant neoplasm of breast: Secondary | ICD-10-CM

## 2022-01-15 ENCOUNTER — Ambulatory Visit
Admission: RE | Admit: 2022-01-15 | Discharge: 2022-01-15 | Disposition: A | Discharge status: Home / Self Care | Payer: BC Managed Care – PPO | Source: Ambulatory Visit | Attending: Family Medicine | Admitting: Family Medicine

## 2022-01-15 DIAGNOSIS — Z1231 Encounter for screening mammogram for malignant neoplasm of breast: Secondary | ICD-10-CM | POA: Insufficient documentation

## 2022-02-11 ENCOUNTER — Ambulatory Visit: Payer: BC Managed Care – PPO | Admitting: Podiatry

## 2022-02-11 ENCOUNTER — Encounter: Payer: Self-pay | Admitting: Podiatry

## 2022-02-11 DIAGNOSIS — G4762 Sleep related leg cramps: Secondary | ICD-10-CM | POA: Diagnosis not present

## 2022-02-11 DIAGNOSIS — G5791 Unspecified mononeuropathy of right lower limb: Secondary | ICD-10-CM | POA: Diagnosis not present

## 2022-02-11 MED ORDER — CYCLOBENZAPRINE HCL 5 MG PO TABS: 5.0000 mg | ORAL_TABLET | Freq: Every day | ORAL | 1 refills | Status: AC

## 2022-02-11 NOTE — Progress Notes (Signed)
   HPI: 66 y.o. female presenting today with a new complaint of pain and numbness to the right lower extremity.  Patient states that she has been experiencing numbness to the lateral aspect of her foot for the past 8 months or so.  She denies a history of injury.  She does state that she began to develop symptoms after knee surgery to the right lower extremity in 2021.  Most recently she has also been developing nocturnal cramping in her calf and leg when she sleeps.  This wakes her up at night and it can be very painful.  She presents for further treatment and evaluation  Past Medical History:  Diagnosis Date   Arthritis    Endometrial polyp    GERD (gastroesophageal reflux disease)    Hypertension    Hypothyroidism    Mild depression    Plantar fasciitis    Tendonitis    Tendonitis     Past Surgical History:  Procedure Laterality Date   ENDOMETRIAL BIOPSY     HYSTEROSCOPY     KNEE ARTHROSCOPY WITH EXCISION PLICA  12/22/2019   Procedure: KNEE ARTHROSCOPY WITH EXCISION PLICA;  Surgeon: Kennedy Bucker, MD;  Location: [facility]S;  Service: Orthopedics;;   KNEE ARTHROSCOPY WITH MEDIAL MENISECTOMY Right 12/22/2019   Procedure: MEDIAL FEMORAL SUBCHONDROPLASTY;  Surgeon: Kennedy Bucker, MD;  Location: [facility];  Service: Orthopedics;  Laterality: Right;   TUBAL LIGATION     WISDOM TOOTH EXTRACTION      Allergies  Allergen Reactions   Dust Mite Extract    Pollen Extract    Tree Extract      Physical Exam: General: The patient is alert and oriented x3 in no acute distress.  Dermatology: Skin is warm, dry and supple bilateral lower extremities. Negative for open lesions or macerations.  Vascular: Palpable pedal pulses bilaterally. Capillary refill within normal limits.  Negative for any significant edema or erythema  Neurological: Light touch and protective threshold grossly intact.  Paresthesia noted along the lateral aspect of the right foot from the heel to the tip of the  toe  Musculoskeletal Exam: No pedal deformities noted.  Patient does experience nocturnal leg cramps nightly  Assessment: 1.  Paresthesia lateral aspect of the right foot 2.  Nocturnal leg cramps right lower extremity   Plan of Care:  1. Patient evaluated.  2. Recommend voltaren gel topical daily to the foot 3. Rx Flexeril 5mg  nightly for nocturnal leg cramps 4.  Recommend good supportive shoes that do not constrict the foot or irritate the lateral aspect of the foot 5.  Return to clinic as needed     , DPM [facility]  Dr. Felecia Shelling, DPM    [address] [place], [place]                Office [phone]  Fax [phone]

## 2022-07-29 ENCOUNTER — Ambulatory Visit: Payer: BC Managed Care – PPO | Admitting: Podiatry

## 2022-08-07 ENCOUNTER — Ambulatory Visit: Payer: BC Managed Care – PPO | Admitting: Podiatry

## 2022-08-07 DIAGNOSIS — M7752 Other enthesopathy of left foot: Secondary | ICD-10-CM | POA: Diagnosis not present

## 2022-08-13 NOTE — Progress Notes (Signed)
Subjective:  Patient ID: Brittany Rocha, female    DOB: 25-Mar-1956,  MRN: [id number]  Chief Complaint  Patient presents with   Callouses    66 y.o. female presents with the above complaint.  Patient presents with left first metatarsophalangeal joint pain.  Patient states pain for touch is progressive gotten worse.  She would like to discuss treatment options for this.  She has not seen anyone else prior to seeing me for this.  She she states injection in the past has helped.  She would like to do another injection.   Review of Systems: Negative except as noted in the HPI. Denies N/V/F/Ch.  Past Medical History:  Diagnosis Date   Arthritis    Endometrial polyp    GERD (gastroesophageal reflux disease)    Hypertension    Hypothyroidism    Mild depression    Plantar fasciitis    Tendonitis    Tendonitis     Current Outpatient Medications:    amLODipine (NORVASC) 5 MG tablet, Take 5 mg by mouth every morning. , Disp: , Rfl:    cyclobenzaprine (FLEXERIL) 5 MG tablet, Take 1 tablet (5 mg total) by mouth at bedtime., Disp: 30 tablet, Rfl: 1   hydrochlorothiazide (HYDRODIURIL) 25 MG tablet, Take 25 mg by mouth daily. , Disp: , Rfl:    HYDROcodone-acetaminophen (NORCO) 5-325 MG tablet, Take 1-2 tablets by mouth every 6 (six) hours as needed for moderate pain. (Patient not taking: Reported on 09/12/2020), Disp: 30 tablet, Rfl: 0   levothyroxine (SYNTHROID, LEVOTHROID) 100 MCG tablet, Take 100 mcg by mouth daily before breakfast. , Disp: , Rfl:    omeprazole (PRILOSEC) 20 MG capsule, Take 20 mg by mouth daily as needed (acid reflux/indigestion.). , Disp: , Rfl:   Current Facility-Administered Medications:    betamethasone acetate-betamethasone sodium phosphate (CELESTONE) injection 3 mg, 3 mg, Intramuscular, Once, Evans, Brent M, DPM   betamethasone acetate-betamethasone sodium phosphate (CELESTONE) injection 3 mg, 3 mg, Intramuscular, Once, Felecia Shelling, DPM  Social History   Tobacco  Use  Smoking Status Never  Smokeless Tobacco Never    Allergies  Allergen Reactions   Dust Mite Extract    Pollen Extract    Tree Extract    Objective:  There were no vitals filed for this visit. There is no height or weight on file to calculate BMI. Constitutional Well developed. Well nourished.  Vascular Dorsalis pedis pulses palpable bilaterally. Posterior tibial pulses palpable bilaterally. Capillary refill normal to all digits.  No cyanosis or clubbing noted. Pedal hair growth normal.  Neurologic Normal speech. Oriented to person, place, and time. Epicritic sensation to light touch grossly present bilaterally.  Dermatologic Nails well groomed and normal in appearance. No open wounds. No skin lesions.  Orthopedic: Pain on palpation left first metatarsophalangeal joint pain with range of motion of the joint limited range of motion noted.  Clinically able to appreciate some arthritis in the joint.  No crepitus noted   Radiographs: None Assessment:   1. Capsulitis of metatarsophalangeal (MTP) joint of left foot    Plan:  Patient was evaluated and treated and all questions answered.  Left first MTP capsulitis -All questions and concerns were discussed with the patient in extensive detail given the amount of pain that she is experiencing she will benefit from steroid injection at decreasing inflammatory component associate with pain.  Patient agrees with plan like to proceed with steroid injection. -A steroid injection was performed at left first MTP capsulitis using 1% plain Lidocaine and  10 mg of Kenalog. This was well tolerated.   No follow-ups on file.

## 2023-01-19 ENCOUNTER — Other Ambulatory Visit: Payer: Self-pay | Admitting: Family Medicine

## 2023-01-19 DIAGNOSIS — Z1231 Encounter for screening mammogram for malignant neoplasm of breast: Secondary | ICD-10-CM

## 2023-01-20 ENCOUNTER — Ambulatory Visit
Admission: RE | Admit: 2023-01-20 | Discharge: 2023-01-20 | Disposition: A | Discharge status: Home / Self Care | Payer: BC Managed Care – PPO | Source: Ambulatory Visit | Attending: Family Medicine | Admitting: Family Medicine

## 2023-01-20 DIAGNOSIS — Z1231 Encounter for screening mammogram for malignant neoplasm of breast: Secondary | ICD-10-CM | POA: Diagnosis present

## 2023-02-13 ENCOUNTER — Ambulatory Visit (INDEPENDENT_AMBULATORY_CARE_PROVIDER_SITE_OTHER): Payer: BC Managed Care – PPO

## 2023-02-13 ENCOUNTER — Ambulatory Visit (INDEPENDENT_AMBULATORY_CARE_PROVIDER_SITE_OTHER): Payer: BC Managed Care – PPO | Admitting: Podiatry

## 2023-02-13 DIAGNOSIS — M722 Plantar fascial fibromatosis: Secondary | ICD-10-CM | POA: Diagnosis not present

## 2023-02-13 MED ORDER — MELOXICAM 15 MG PO TABS: 15.0000 mg | ORAL_TABLET | Freq: Every day | ORAL | 1 refills | Status: AC

## 2023-02-13 NOTE — Progress Notes (Signed)
   Chief Complaint  Patient presents with   Foot Orthotics    Patient came in today for orthotics    HPI: 67 y.o. female presenting today requesting orthotics fitting today.  Patient has worn orthotics in the past and she says that they do help.  Patient has a history of plantar fasciitis and generalized foot and ankle pain.  Presenting for further treatment and evaluation  Past Medical History:  Diagnosis Date   Arthritis    Endometrial polyp    GERD (gastroesophageal reflux disease)    Hypertension    Hypothyroidism    Mild depression    Plantar fasciitis    Tendonitis    Tendonitis     Past Surgical History:  Procedure Laterality Date   ENDOMETRIAL BIOPSY     HYSTEROSCOPY     KNEE ARTHROSCOPY WITH EXCISION PLICA  12/22/2019   Procedure: KNEE ARTHROSCOPY WITH EXCISION PLICA;  Surgeon: Kennedy Bucker, MD;  Location: [facility];  Service: Orthopedics;;   KNEE ARTHROSCOPY WITH MEDIAL MENISECTOMY Right 12/22/2019   Procedure: MEDIAL FEMORAL SUBCHONDROPLASTY;  Surgeon: Kennedy Bucker, MD;  Location: [facility];  Service: Orthopedics;  Laterality: Right;   TUBAL LIGATION     WISDOM TOOTH EXTRACTION      Allergies  Allergen Reactions   Dust Mite Extract    Pollen Extract    Tree Extract      Physical Exam: General: The patient is alert and oriented x3 in no acute distress.  Dermatology: Skin is warm, dry and supple bilateral lower extremities.   Vascular: Palpable pedal pulses bilaterally. Capillary refill within normal limits.  No appreciable edema.  No erythema.  Neurological: Grossly intact via light touch  Musculoskeletal Exam: No pedal deformities noted.  There is some tenderness with palpation along the plantar fascia bilateral.  Mild collapse of the medial longitudinal arch with weightbearing  Assessment/Plan of Care: 1.  Plantar fasciitis bilateral 2.  Generalized bilateral foot and ankle pain  -Patient evaluated. -Prescription for meloxicam 15 mg daily -Today the  patient was molded for custom molded orthotics.  Order placed in the chart -Return to clinic orthotics dispensable       Felecia Shelling, DPM [facility]  Dr. Felecia Shelling, DPM    [address] [place], [place] [address]                Office [phone]  Fax [phone]

## 2023-02-13 NOTE — Progress Notes (Incomplete)
Patient presents today to be casted for custom molded orthotics. EVANS is the treating physician.  Impression scan cast was taken. ABN signed.  Patient info-  Shoe size: 11.5(IN WOMEN)  Height: 5FT 6IN  Weight: 260  Insurance: [employer]   Patient will be notified once orthotics arrive in office and reappoint for fitting at that time.

## 2023-02-27 ENCOUNTER — Other Ambulatory Visit: Payer: Self-pay | Admitting: Podiatry

## 2023-02-27 DIAGNOSIS — M722 Plantar fascial fibromatosis: Secondary | ICD-10-CM

## 2023-03-10 ENCOUNTER — Telehealth: Payer: Self-pay | Admitting: Podiatry

## 2023-03-10 NOTE — Telephone Encounter (Signed)
Lmom for pt to call back to schedule picking up orthotics    Pending insurance  Currently in [place] will transfer to [place] when scheduled

## 2023-03-11 ENCOUNTER — Telehealth: Payer: Self-pay | Admitting: Podiatry

## 2023-03-11 NOTE — Telephone Encounter (Signed)
Pt called in to make an appointment for orthotic pick up and was upset the wait was soo long I explain to pt rn we only have Fridays avail in [place]. Pt got upset and hung up.

## 2023-04-23 ENCOUNTER — Encounter: Payer: Self-pay | Admitting: Gastroenterology

## 2023-04-23 NOTE — H&P (Signed)
Pre-Procedure H&P   Patient ID: Brittany Rocha is a 67 y.o. female.  Gastroenterology Provider: Jaynie Collins, DO  Referring Provider: Dr. Burnadette Pop PCP: Marisue Ivan, MD  Date: 04/24/2023  HPI Brittany Rocha is a 67 y.o. female who presents today for Colonoscopy for colorectal cancer screening .  No family history of colon cancer or colon polyps.  Patient last underwent colonoscopy in January 2014 which was reportedly normal.  EGD at that time demonstrated gastritis with biopsies negative for H. pylori.  She reports daily bowel movement without melena or hematochezia A1c 5.9 hemoglobin 14.1 MCV 93 platelets 220,000 creatinine 0.8   Past Medical History:  Diagnosis Date   Arthritis    Endometrial polyp    GERD (gastroesophageal reflux disease)    Hypertension    Hypothyroidism    Mild depression    Plantar fasciitis    Tendonitis    Tendonitis     Past Surgical History:  Procedure Laterality Date   ENDOMETRIAL BIOPSY     HYSTEROSCOPY     KNEE ARTHROSCOPY WITH EXCISION PLICA  12/22/2019   Procedure: KNEE ARTHROSCOPY WITH EXCISION PLICA;  Surgeon: Kennedy Bucker, MD;  Location: [facility];  Service: Orthopedics;;   KNEE ARTHROSCOPY WITH MEDIAL MENISECTOMY Right 12/22/2019   Procedure: MEDIAL FEMORAL SUBCHONDROPLASTY;  Surgeon: Kennedy Bucker, MD;  Location: [facility];  Service: Orthopedics;  Laterality: Right;   TUBAL LIGATION     WISDOM TOOTH EXTRACTION      Family History No h/o GI disease or malignancy  Review of Systems  Constitutional:  Negative for activity change, appetite change, chills, diaphoresis, fatigue, fever and unexpected weight change.  HENT:  Negative for trouble swallowing and voice change.   Respiratory:  Negative for shortness of breath and wheezing.   Cardiovascular:  Negative for chest pain, palpitations and leg swelling.  Gastrointestinal:  Negative for abdominal distention, abdominal pain, anal bleeding, blood in stool,  constipation, diarrhea, nausea, rectal pain and vomiting.  Musculoskeletal:  Negative for arthralgias and myalgias.  Skin:  Negative for color change and pallor.  Neurological:  Negative for dizziness, syncope and weakness.  Psychiatric/Behavioral:  Negative for confusion.   All other systems reviewed and are negative.    Medications No current facility-administered medications on file prior to encounter.   Current Outpatient Medications on File Prior to Encounter  Medication Sig Dispense Refill   hydrochlorothiazide (HYDRODIURIL) 25 MG tablet Take 25 mg by mouth daily.      levothyroxine (SYNTHROID, LEVOTHROID) 100 MCG tablet Take 100 mcg by mouth daily before breakfast.      amLODipine (NORVASC) 5 MG tablet Take 5 mg by mouth every morning.  (Patient not taking: Reported on 04/24/2023)     cyclobenzaprine (FLEXERIL) 5 MG tablet Take 1 tablet (5 mg total) by mouth at bedtime. 30 tablet 1   HYDROcodone-acetaminophen (NORCO) 5-325 MG tablet Take 1-2 tablets by mouth every 6 (six) hours as needed for moderate pain. (Patient not taking: Reported on 09/12/2020) 30 tablet 0   meloxicam (MOBIC) 15 MG tablet Take 1 tablet (15 mg total) by mouth daily. 60 tablet 1   omeprazole (PRILOSEC) 20 MG capsule Take 20 mg by mouth daily as needed (acid reflux/indigestion.).       Pertinent medications related to GI and procedure were reviewed by me with the patient prior to the procedure   Current Facility-Administered Medications:    0.9 %  sodium chloride infusion, , Intravenous, Continuous, Jaynie Collins, DO  sodium chloride  Allergies  Allergen Reactions   Dust Mite Extract    Pollen Extract    Tree Extract    Allergies were reviewed by me prior to the procedure  Objective   Body mass index is 43.39 kg/m. Vitals:   04/24/23 0816  BP: (!) 175/100  Pulse: 88  Resp: 16  Temp: (!) 97.5 F (36.4 C)  TempSrc: Temporal  SpO2: 100%  Weight: 121.9 kg  Height: 5\' 6"  (1.676  m)     Physical Exam Vitals and nursing note reviewed.  Constitutional:      General: She is not in acute distress.    Appearance: Normal appearance. She is obese. She is not ill-appearing, toxic-appearing or diaphoretic.  HENT:     Head: Normocephalic and atraumatic.     Nose: Nose normal.     Mouth/Throat:     Mouth: Mucous membranes are moist.     Pharynx: Oropharynx is clear.  Eyes:     General: No scleral icterus.    Extraocular Movements: Extraocular movements intact.  Cardiovascular:     Rate and Rhythm: Normal rate and regular rhythm.     Heart sounds: Normal heart sounds. No murmur heard.    No friction rub. No gallop.  Pulmonary:     Effort: Pulmonary effort is normal. No respiratory distress.     Breath sounds: Normal breath sounds. No wheezing, rhonchi or rales.  Abdominal:     General: Bowel sounds are normal. There is no distension.     Palpations: Abdomen is soft.     Tenderness: There is no abdominal tenderness. There is no guarding or rebound.  Musculoskeletal:     Cervical back: Neck supple.     Right lower leg: No edema.     Left lower leg: No edema.  Skin:    General: Skin is warm and dry.     Coloration: Skin is not jaundiced or pale.  Neurological:     General: No focal deficit present.     Mental Status: She is alert and oriented to person, place, and time. Mental status is at baseline.  Psychiatric:        Mood and Affect: Mood normal.        Behavior: Behavior normal.        Thought Content: Thought content normal.        Judgment: Judgment normal.      Assessment:  Brittany Rocha is a 67 y.o. female  who presents today for Colonoscopy for colorectal cancer screening .  Plan:  Colonoscopy with possible intervention today  Colonoscopy with possible biopsy, control of bleeding, polypectomy, and interventions as necessary has been discussed with the patient/patient representative. Informed consent was obtained from the patient/patient  representative after explaining the indication, nature, and risks of the procedure including but not limited to death, bleeding, perforation, missed neoplasm/lesions, cardiorespiratory compromise, and reaction to medications. Opportunity for questions was given and appropriate answers were provided. Patient/patient representative has verbalized understanding is amenable to undergoing the procedure.   Jaynie Collins, DO  [facility]  Portions of the record may have been created with voice recognition software. Occasional wrong-word or 'sound-a-like' substitutions may have occurred due to the inherent limitations of voice recognition software.  Read the chart carefully and recognize, using context, where substitutions may have occurred.

## 2023-04-24 ENCOUNTER — Ambulatory Visit
Admission: RE | Admit: 2023-04-24 | Discharge: 2023-04-24 | Disposition: A | Discharge status: Home / Self Care | Payer: BC Managed Care – PPO | Attending: Gastroenterology | Admitting: Gastroenterology

## 2023-04-24 ENCOUNTER — Encounter
Admission: RE | Disposition: A | Discharge status: Home / Self Care | Payer: Self-pay | Source: Home / Self Care | Attending: Gastroenterology

## 2023-04-24 ENCOUNTER — Ambulatory Visit: Payer: BC Managed Care – PPO | Admitting: Certified Registered"

## 2023-04-24 ENCOUNTER — Encounter: Payer: Self-pay | Admitting: Gastroenterology

## 2023-04-24 DIAGNOSIS — K621 Rectal polyp: Secondary | ICD-10-CM | POA: Diagnosis not present

## 2023-04-24 DIAGNOSIS — Z1211 Encounter for screening for malignant neoplasm of colon: Secondary | ICD-10-CM | POA: Insufficient documentation

## 2023-04-24 DIAGNOSIS — Z6841 Body Mass Index (BMI) 40.0 and over, adult: Secondary | ICD-10-CM | POA: Insufficient documentation

## 2023-04-24 DIAGNOSIS — I10 Essential (primary) hypertension: Secondary | ICD-10-CM | POA: Insufficient documentation

## 2023-04-24 DIAGNOSIS — K219 Gastro-esophageal reflux disease without esophagitis: Secondary | ICD-10-CM | POA: Diagnosis not present

## 2023-04-24 DIAGNOSIS — K635 Polyp of colon: Secondary | ICD-10-CM | POA: Diagnosis not present

## 2023-04-24 HISTORY — PX: POLYPECTOMY: SHX5525

## 2023-04-24 HISTORY — PX: COLONOSCOPY WITH PROPOFOL: SHX5780

## 2023-04-24 SURGERY — COLONOSCOPY WITH PROPOFOL
Anesthesia: General

## 2023-04-24 MED ORDER — PROPOFOL 500 MG/50ML IV EMUL
INTRAVENOUS | Status: DC | PRN
  Administered 2023-04-24: 135 ug/kg/min via INTRAVENOUS

## 2023-04-24 MED ORDER — PROPOFOL 1000 MG/100ML IV EMUL
INTRAVENOUS | Status: AC
  Filled 2023-04-24: qty 100

## 2023-04-24 MED ORDER — PROPOFOL 10 MG/ML IV BOLUS
INTRAVENOUS | Status: DC | PRN
  Administered 2023-04-24: 40 mg via INTRAVENOUS
  Administered 2023-04-24: 20 mg via INTRAVENOUS

## 2023-04-24 MED ORDER — SODIUM CHLORIDE 0.9 % IV SOLN: INTRAVENOUS | Status: DC

## 2023-04-24 NOTE — Anesthesia Postprocedure Evaluation (Signed)
Anesthesia Post Note  Patient: Brittany Rocha  Procedure(s) Performed: COLONOSCOPY WITH PROPOFOL POLYPECTOMY  Patient location during evaluation: PACU Anesthesia Type: General Level of consciousness: awake and alert, oriented and patient cooperative Pain management: pain level controlled Vital Signs Assessment: post-procedure vital signs reviewed and stable Respiratory status: spontaneous breathing, nonlabored ventilation and respiratory function stable Cardiovascular status: blood pressure returned to baseline and stable Postop Assessment: adequate PO intake Anesthetic complications: no   No notable events documented.   Last Vitals:  Vitals:   04/24/23 0816 04/24/23 0906  BP: (!) 175/100 114/72  Pulse: 88   Resp: 16 15  Temp: (!) 36.4 C   SpO2: 100%     Last Pain:  Vitals:   04/24/23 0906  TempSrc:   PainSc: 0-No pain                 Reed Breech

## 2023-04-24 NOTE — Anesthesia Preprocedure Evaluation (Addendum)
Anesthesia Evaluation  Patient identified by MRN, date of birth, ID band Patient awake    Reviewed: Allergy & Precautions, NPO status , Patient's Chart, lab work & pertinent test results  History of Anesthesia Complications Negative for: history of anesthetic complications  Airway Mallampati: IV   Neck ROM: Full    Dental  (+) Missing   Pulmonary neg pulmonary ROS   Pulmonary exam normal breath sounds clear to auscultation       Cardiovascular hypertension, Normal cardiovascular exam Rhythm:Regular Rate:Normal     Neuro/Psych  PSYCHIATRIC DISORDERS  Depression    negative neurological ROS     GI/Hepatic ,GERD  ,,  Endo/Other  Hypothyroidism  Class 3 obesity  Renal/GU negative Renal ROS     Musculoskeletal  (+) Arthritis ,    Abdominal   Peds  Hematology negative hematology ROS (+)   Anesthesia Other Findings   Reproductive/Obstetrics                             Anesthesia Physical Anesthesia Plan  ASA: 3  Anesthesia Plan: General   Post-op Pain Management:    Induction: Intravenous  PONV Risk Score and Plan: 3 and Propofol infusion, TIVA and Treatment may vary due to age or medical condition  Airway Management Planned: Natural Airway  Additional Equipment:   Intra-op Plan:   Post-operative Plan:   Informed Consent: I have reviewed the patients History and Physical, chart, labs and discussed the procedure including the risks, benefits and alternatives for the proposed anesthesia with the patient or authorized representative who has indicated his/her understanding and acceptance.       Plan Discussed with: CRNA  Anesthesia Plan Comments: (LMA/GETA backup discussed.  Patient consented for risks of anesthesia including but not limited to:  - adverse reactions to medications - damage to eyes, teeth, lips or other oral mucosa - nerve damage due to positioning  - sore  throat or hoarseness - damage to heart, brain, nerves, lungs, other parts of body or loss of life  Informed patient about role of CRNA in peri- and intra-operative care.  Patient voiced understanding.)        Anesthesia Quick Evaluation

## 2023-04-24 NOTE — Op Note (Signed)
[facility] Gastroenterology Patient Name: Brittany Rocha Procedure Date: 04/24/2023 8:05 AM MRN: [id number] Account #: [id number] Date of Birth: 1955-10-18 Admit Type: Outpatient Age: 67 Room: [facility] ENDO ROOM 1 Gender: Female Note Status: Finalized Instrument Name: Colonscope [id number] Procedure:             Colonoscopy Indications:           Screening for colorectal malignant neoplasm Providers:             Trenda Moots, DO Referring MD:          Jaynie Collins DO, DO (Referring MD), Marisue Ivan (Referring MD) Medicines:             Monitored Anesthesia Care Complications:         No immediate complications. Estimated blood loss:                         Minimal. Procedure:             Pre-Anesthesia Assessment:                        - Prior to the procedure, a History and Physical was                         performed, and patient medications and allergies were                         reviewed. The patient is competent. The risks and                         benefits of the procedure and the sedation options and                         risks were discussed with the patient. All questions                         were answered and informed consent was obtained.                         Patient identification and proposed procedure were                         verified by the physician, the nurse, the anesthetist                         and the technician in the endoscopy suite. Mental                         Status Examination: alert and oriented. Airway                         Examination: normal oropharyngeal airway and neck                         mobility. Respiratory Examination: clear to  auscultation. CV Examination: RRR, no murmurs, no S3                         or S4. Prophylactic Antibiotics: The patient does not                         require prophylactic antibiotics. Prior                          Anticoagulants: The patient has taken no anticoagulant                         or antiplatelet agents. ASA Grade Assessment: III - A                         patient with severe systemic disease. After reviewing                         the risks and benefits, the patient was deemed in                         satisfactory condition to undergo the procedure. The                         anesthesia plan was to use monitored anesthesia care                         (MAC). Immediately prior to administration of                         medications, the patient was re-assessed for adequacy                         to receive sedatives. The heart rate, respiratory                         rate, oxygen saturations, blood pressure, adequacy of                         pulmonary ventilation, and response to care were                         monitored throughout the procedure. The physical                         status of the patient was re-assessed after the                         procedure.                        After obtaining informed consent, the colonoscope was                         passed under direct vision. Throughout the procedure,                         the patient's blood pressure, pulse, and oxygen  saturations were monitored continuously. The                         Colonoscope was introduced through the anus and                         advanced to the the cecum, identified by appendiceal                         orifice and ileocecal valve. The colonoscopy was                         performed without difficulty. The patient tolerated                         the procedure well. The quality of the bowel                         preparation was evaluated using the BBPS [facility] Bowel                         Preparation Scale) with scores of: Right Colon = 3,                         Transverse Colon = 3 and Left Colon = 3 (entire mucosa                          seen well with no residual staining, small fragments                         of stool or opaque liquid). The total BBPS score                         equals 9. The ileocecal valve, appendiceal orifice,                         and rectum were photographed. Findings:      The perianal and digital rectal examinations were normal. Pertinent       negatives include normal sphincter tone.      Retroflexion in the right colon was performed.      Five sessile polyps were found in the rectum (2), descending colon (2)       and transverse colon (1). The polyps were 1 to 2 mm in size. These       polyps were removed with a jumbo cold forceps. Resection and retrieval       were complete. Estimated blood loss was minimal.      The exam was otherwise without abnormality on direct and retroflexion       views. Impression:            - Five 1 to 2 mm polyps in the rectum, in the                         descending colon and in the transverse colon, removed                         with a jumbo cold forceps. Resected and  retrieved.                        - The examination was otherwise normal on direct and                         retroflexion views. Recommendation:        - Patient has a contact number available for                         emergencies. The signs and symptoms of potential                         delayed complications were discussed with the patient.                         Return to normal activities tomorrow. Written                         discharge instructions were provided to the patient.                        - Discharge patient to home.                        - Resume previous diet.                        - Continue present medications.                        - Await pathology results.                        - Repeat colonoscopy for surveillance based on                         pathology results.                        - Return to referring physician as previously                          scheduled.                        - The findings and recommendations were discussed with                         the patient. Procedure Code(s):     --- Professional ---                        [phone], Colonoscopy, flexible; with biopsy, single or                         multiple Diagnosis Code(s):     --- Professional ---                        Z12.11, Encounter for screening for malignant neoplasm  of colon                        D12.8, Benign neoplasm of rectum                        D12.4, Benign neoplasm of descending colon                        D12.3, Benign neoplasm of transverse colon (hepatic                         flexure or splenic flexure) CPT copyright 2022 American Medical Association. All rights reserved. The codes documented in this report are preliminary and upon coder review may  be revised to meet current compliance requirements. Attending Participation:      I personally performed the entire procedure. Elfredia Nevins, DO Jaynie Collins DO, DO 04/24/2023 9:06:44 AM This report has been signed electronically. Number of Addenda: 0 Note Initiated On: 04/24/2023 8:05 AM Scope Withdrawal Time: 0 hours 15 minutes 5 seconds  Total Procedure Duration: 0 hours 21 minutes 41 seconds  Estimated Blood Loss:  Estimated blood loss was minimal.      [facility]

## 2023-04-24 NOTE — Anesthesia Procedure Notes (Signed)
Procedure Name: MAC Date/Time: 04/24/2023 8:35 AM  Performed by: Nelle Don, CRNAPre-anesthesia Checklist: Patient identified, Emergency Drugs available, Suction available and Patient being monitored Oxygen Delivery Method: Simple face mask

## 2023-04-24 NOTE — Interval H&P Note (Signed)
History and Physical Interval Note: Preprocedure H&P from 04/24/23  was reviewed and there was no interval change after seeing and examining the patient.  Written consent was obtained from the patient after discussion of risks, benefits, and alternatives. Patient has consented to proceed with Colonoscopy with possible intervention   04/24/2023 8:30 AM  Brittany Rocha  has presented today for surgery, with the diagnosis of Z12.11 (ICD-10-CM) - Colon cancer screening.  The various methods of treatment have been discussed with the patient and family. After consideration of risks, benefits and other options for treatment, the patient has consented to  Procedure(s): COLONOSCOPY WITH PROPOFOL (N/A) as a surgical intervention.  The patient's history has been reviewed, patient examined, no change in status, stable for surgery.  I have reviewed the patient's chart and labs.  Questions were answered to the patient's satisfaction.     Brittany Rocha

## 2023-04-24 NOTE — Transfer of Care (Signed)
Immediate Anesthesia Transfer of Care Note  Patient: Brittany Rocha  Procedure(s) Performed: COLONOSCOPY WITH PROPOFOL POLYPECTOMY  Patient Location: PACU  Anesthesia Type:MAC  Level of Consciousness: drowsy  Airway & Oxygen Therapy: Patient Spontanous Breathing and Patient connected to face mask oxygen  Post-op Assessment: Report given to RN, Post -op Vital signs reviewed and stable, and Patient moving all extremities X 4  Post vital signs: Reviewed and stable  Last Vitals:  Vitals Value Taken Time  BP 114/72 04/24/23 0906  Temp    Pulse 86 04/24/23 0907  Resp 15 04/24/23 0907  SpO2 96 % 04/24/23 0907  Vitals shown include unfiled device data.  Last Pain:  Vitals:   04/24/23 0816  TempSrc: Temporal  PainSc: 0-No pain         Complications: No notable events documented.

## 2023-04-27 ENCOUNTER — Encounter: Payer: Self-pay | Admitting: Gastroenterology

## 2023-05-12 ENCOUNTER — Ambulatory Visit: Payer: BC Managed Care – PPO | Admitting: Podiatry

## 2023-05-12 DIAGNOSIS — M722 Plantar fascial fibromatosis: Secondary | ICD-10-CM

## 2023-05-12 NOTE — Patient Instructions (Signed)

## 2023-05-12 NOTE — Progress Notes (Signed)
   Chief Complaint  Patient presents with   Foot Orthotics    "I brought my shoes to pick up my orthotics."   Foot Pain    "My feet hurt and my left one swells."    HPI: 67 y.o. female presenting today to pick up her custom molded orthotics.  Patient has worn orthotics in the past and she says that they do help.  Patient has a history of plantar fasciitis and generalized foot and ankle pain.  Presenting for further treatment and evaluation  Past Medical History:  Diagnosis Date   Arthritis    Endometrial polyp    GERD (gastroesophageal reflux disease)    Hypertension    Hypothyroidism    Mild depression    Plantar fasciitis    Tendonitis    Tendonitis     Past Surgical History:  Procedure Laterality Date   COLONOSCOPY WITH PROPOFOL N/A 04/24/2023   Procedure: COLONOSCOPY WITH PROPOFOL;  Surgeon: Jaynie Collins, DO;  Location: [facility] ENDOSCOPY;  Service: Gastroenterology;  Laterality: N/A;   ENDOMETRIAL BIOPSY     HYSTEROSCOPY     KNEE ARTHROSCOPY WITH EXCISION PLICA  12/22/2019   Procedure: KNEE ARTHROSCOPY WITH EXCISION PLICA;  Surgeon: Kennedy Bucker, MD;  Location: [facility] ORS;  Service: Orthopedics;;   KNEE ARTHROSCOPY WITH MEDIAL MENISECTOMY Right 12/22/2019   Procedure: MEDIAL FEMORAL SUBCHONDROPLASTY;  Surgeon: Kennedy Bucker, MD;  Location: [facility] ORS;  Service: Orthopedics;  Laterality: Right;   POLYPECTOMY  04/24/2023   Procedure: POLYPECTOMY;  Surgeon: Jaynie Collins, DO;  Location: [facility] ENDOSCOPY;  Service: Gastroenterology;;   TUBAL LIGATION     WISDOM TOOTH EXTRACTION      Allergies  Allergen Reactions   Dust Mite Extract    Pollen Extract    Tree Extract      Physical Exam: General: The patient is alert and oriented x3 in no acute distress.  Dermatology: Skin is warm, dry and supple bilateral lower extremities.   Vascular: Palpable pedal pulses bilaterally. Capillary refill within normal limits.  No appreciable edema.  No erythema.  Neurological:  Grossly intact via light touch  Musculoskeletal Exam: No pedal deformities noted.  There is some tenderness with palpation along the plantar fascia bilateral.  Mild collapse of the medial longitudinal arch with weightbearing  Assessment/Plan of Care: 1.  Plantar fasciitis bilateral 2.  Generalized bilateral foot and ankle pain  -Patient evaluated. - Continue meloxicam 15 mg daily as needed - Orthotics dispensed today -Return to clinic as needed     Felecia Shelling, DPM [facility]  Dr. Felecia Shelling, DPM    [address] [place], [place] [address]                Office [phone]  Fax [phone]

## 2024-02-10 ENCOUNTER — Other Ambulatory Visit: Payer: Self-pay | Admitting: Family Medicine

## 2024-02-10 DIAGNOSIS — Z1231 Encounter for screening mammogram for malignant neoplasm of breast: Secondary | ICD-10-CM

## 2024-02-23 ENCOUNTER — Ambulatory Visit
Admission: RE | Admit: 2024-02-23 | Discharge: 2024-02-23 | Disposition: A | Discharge status: Home / Self Care | Source: Ambulatory Visit | Attending: Family Medicine | Admitting: Family Medicine

## 2024-02-23 DIAGNOSIS — Z1231 Encounter for screening mammogram for malignant neoplasm of breast: Secondary | ICD-10-CM | POA: Diagnosis present

## 2024-04-08 ENCOUNTER — Encounter: Payer: Self-pay | Admitting: Podiatry

## 2024-04-08 ENCOUNTER — Ambulatory Visit: Admitting: Podiatry

## 2024-04-08 DIAGNOSIS — M2041 Other hammer toe(s) (acquired), right foot: Secondary | ICD-10-CM

## 2024-04-08 NOTE — Progress Notes (Signed)
 Chief Complaint  Patient presents with   Toe Pain    Rm7 Patient complains of burning in right 2nd toe and corn medial side of toe that started 2 months ago and she has tried  epsom salt soaks with no relief.    HPI: 68 y.o. female presenting today for follow-up evaluation of pain and tenderness associated to the right second toe.  Patient says that she has tried wide fitting shoes and toe spacers but she continues to have pain and tenderness associated to the second digit of the right foot.  She says that she would like to discuss options to permanently correct for her painful digit.  Past Medical History:  Diagnosis Date   Arthritis    Endometrial polyp    GERD (gastroesophageal reflux disease)    Hypertension    Hypothyroidism    Mild depression    Plantar fasciitis    Tendonitis    Tendonitis     Past Surgical History:  Procedure Laterality Date   COLONOSCOPY WITH PROPOFOL  N/A 04/24/2023   Procedure: COLONOSCOPY WITH PROPOFOL ;  Surgeon: Onita Elspeth Sharper, DO;  Location: [facility] ENDOSCOPY;  Service: Gastroenterology;  Laterality: N/A;   ENDOMETRIAL BIOPSY     HYSTEROSCOPY     KNEE ARTHROSCOPY WITH EXCISION PLICA  12/22/2019   Procedure: KNEE ARTHROSCOPY WITH EXCISION PLICA;  Surgeon: Kathlynn Sharper, MD;  Location: [facility] ORS;  Service: Orthopedics;;   KNEE ARTHROSCOPY WITH MEDIAL MENISECTOMY Right 12/22/2019   Procedure: MEDIAL FEMORAL SUBCHONDROPLASTY;  Surgeon: Kathlynn Sharper, MD;  Location: [facility] ORS;  Service: Orthopedics;  Laterality: Right;   POLYPECTOMY  04/24/2023   Procedure: POLYPECTOMY;  Surgeon: Onita Elspeth Sharper, DO;  Location: [facility] ENDOSCOPY;  Service: Gastroenterology;;   TUBAL LIGATION     WISDOM TOOTH EXTRACTION      Allergies  Allergen Reactions   Dust Mite Extract    Pollen Extract    Tree Extract      Physical Exam: General: The patient is alert and oriented x3 in no acute distress.  Dermatology: Skin is warm, dry and supple bilateral lower  extremities.   Vascular: Palpable pedal pulses bilaterally. Capillary refill within normal limits.  No appreciable edema.  No erythema.  Neurological: Grossly intact via light touch  Musculoskeletal Exam: Enlargement of the PIPJ of the second digit right foot noted pressing against the adjacent great toe.  Overlying callus to the area as well.  There is associated tenderness to this region.  Assessment/Plan of Care: 1.  Symptomatic enlargement of the PIPJ second digit right foot/hammertoe deformity  -Patient evaluated.  X-rays reviewed taken 03/22/2019 -Unfortunate the patient continues to have pain and tenderness associated to the medial aspect of the PIPJ second digit for several years now.  She is ready to have it corrected for surgically since conservative management has been unsuccessful in providing any sort of lasting alleviation of symptoms -We discussed hammertoe arthroplasty of the second digit today.  Risk benefits advantages and disadvantage of the procedure were explained in detail.  No guarantees were expressed or implied.  Postoperative recovery course was also explained.  All patient questions were answered.  The patient would like to pursue surgery to correct for the symptomatic enlargement of the PIPJ of the second digit -Authorization for surgery was initiated today.  Surgery will consist of PIPJ arthroplasty second digit right foot -Postoperative shoe dispensed -Return to clinic 1 week postop       Thresa EMERSON Sar, DPM [facility]  Dr.  Thresa EMERSON Sar, DPM    [address] [place], [place] [address]                Office [phone]  Fax [phone]

## 2024-04-14 ENCOUNTER — Telehealth: Payer: Self-pay | Admitting: Podiatry

## 2024-04-14 NOTE — Telephone Encounter (Signed)
 Pt called back and is scheduled for surgery on 8/14. She is not on any blood thinners or GLP1 medications and pharmacy is correct in chart.

## 2024-04-14 NOTE — Telephone Encounter (Signed)
 Received surgical consent forms.   Called and left message for pt to call to get her surgery scheduled.

## 2024-04-28 ENCOUNTER — Telehealth: Payer: Self-pay | Admitting: Podiatry

## 2024-04-28 DIAGNOSIS — Z0279 Encounter for issue of other medical certificate: Secondary | ICD-10-CM

## 2024-04-28 NOTE — Telephone Encounter (Signed)
 DOS- 05/05/2024  2ND HAMMERTOE REPAIR RT- 28285  [employer] EFFECTIVE DATE- 09/23/2023  DEDUCTIBLE- $1000 REMAINING- $1000 OOP- $5500 REMAINING- $5280.03 COINSURANCE- 20% NO COPAY  PER [employer] WEBSITE, NO PRIOR AUTH REQUIRED FOR CPT CODE 71714. DOCUMENTATION ATTACHED TO SURGERY CONSENT PACKET. TYPE OF SURGERY NOT FILED THROUGH [facility]

## 2024-04-29 ENCOUNTER — Telehealth: Payer: Self-pay | Admitting: Podiatry

## 2024-04-29 NOTE — Telephone Encounter (Signed)
 lft mess on vmail for pt to call me back. recd leave forms for her surgery. I can email them to her or fax back to [employer] and she can pick from there. DOS 05/05/24

## 2024-05-03 ENCOUNTER — Telehealth: Payer: Self-pay | Admitting: Podiatry

## 2024-05-03 NOTE — Telephone Encounter (Signed)
 pt cld bk in.she will pick up forms from [employer] office. RTW approx 07/25/24 and last dy at work is 05/04/24. I adv if wants to go back sooner let me know and I can send note. I will let her know when forms are ready for her get from [employer] office.

## 2024-05-03 NOTE — Telephone Encounter (Signed)
 Mess lft

## 2024-05-04 ENCOUNTER — Telehealth: Payer: Self-pay | Admitting: Podiatry

## 2024-05-04 NOTE — Telephone Encounter (Signed)
 lft mess on pt's vmail she can go by the [employer] office and pick up forms I adv [place] pt will be coming and to make a copy for her records as well.

## 2024-05-05 ENCOUNTER — Other Ambulatory Visit: Payer: Self-pay | Admitting: Podiatry

## 2024-05-05 DIAGNOSIS — M2041 Other hammer toe(s) (acquired), right foot: Secondary | ICD-10-CM | POA: Diagnosis not present

## 2024-05-05 MED ORDER — OXYCODONE-ACETAMINOPHEN 5-325 MG PO TABS: 1.0000 | ORAL_TABLET | ORAL | 0 refills | Status: AC | PRN

## 2024-05-05 MED ORDER — IBUPROFEN 800 MG PO TABS: 800.0000 mg | ORAL_TABLET | Freq: Three times a day (TID) | ORAL | 1 refills | Status: AC

## 2024-05-05 NOTE — Progress Notes (Signed)
 PRN postop

## 2024-05-09 ENCOUNTER — Telehealth: Payer: Self-pay | Admitting: Podiatry

## 2024-05-09 DIAGNOSIS — Z0271 Encounter for disability determination: Secondary | ICD-10-CM

## 2024-05-09 NOTE — Telephone Encounter (Signed)
 See notes.

## 2024-05-09 NOTE — Telephone Encounter (Signed)
 Recd forms from [employer]. Faxed forms/notes to [phone]

## 2024-05-13 ENCOUNTER — Encounter: Payer: Self-pay | Admitting: Podiatry

## 2024-05-13 ENCOUNTER — Ambulatory Visit (INDEPENDENT_AMBULATORY_CARE_PROVIDER_SITE_OTHER)

## 2024-05-13 ENCOUNTER — Ambulatory Visit (INDEPENDENT_AMBULATORY_CARE_PROVIDER_SITE_OTHER): Admitting: Podiatry

## 2024-05-13 VITALS — Ht 66.0 in | Wt 268.8 lb

## 2024-05-13 DIAGNOSIS — M2041 Other hammer toe(s) (acquired), right foot: Secondary | ICD-10-CM

## 2024-05-20 ENCOUNTER — Ambulatory Visit: Admitting: Podiatry

## 2024-05-20 ENCOUNTER — Encounter: Payer: Self-pay | Admitting: Podiatry

## 2024-05-20 VITALS — Ht 66.0 in | Wt 268.8 lb

## 2024-05-20 DIAGNOSIS — Z9889 Other specified postprocedural states: Secondary | ICD-10-CM

## 2024-05-20 DIAGNOSIS — M2041 Other hammer toe(s) (acquired), right foot: Secondary | ICD-10-CM

## 2024-05-20 NOTE — Progress Notes (Signed)
   Chief Complaint  Patient presents with   Routine Post Op    POV # 2 DOS 8/14 RT 2ND TO HAMMERTOE ARTHROPLASTY, pt states everything is going well has no complaints.     Subjective:  Patient presents today status post RT second hammertoe correction. DOS: 05/05/2024. Feeling well. No new complaints.   Past Medical History:  Diagnosis Date   Arthritis    Endometrial polyp    GERD (gastroesophageal reflux disease)    Hypertension    Hypothyroidism    Mild depression    Plantar fasciitis    Tendonitis    Tendonitis     Past Surgical History:  Procedure Laterality Date   COLONOSCOPY WITH PROPOFOL  N/A 04/24/2023   Procedure: COLONOSCOPY WITH PROPOFOL ;  Surgeon: Onita Elspeth Sharper, DO;  Location: [facility] ENDOSCOPY;  Service: Gastroenterology;  Laterality: N/A;   ENDOMETRIAL BIOPSY     HYSTEROSCOPY     KNEE ARTHROSCOPY WITH EXCISION PLICA  12/22/2019   Procedure: KNEE ARTHROSCOPY WITH EXCISION PLICA;  Surgeon: Kathlynn Sharper, MD;  Location: [facility] ORS;  Service: Orthopedics;;   KNEE ARTHROSCOPY WITH MEDIAL MENISECTOMY Right 12/22/2019   Procedure: MEDIAL FEMORAL SUBCHONDROPLASTY;  Surgeon: Kathlynn Sharper, MD;  Location: [facility] ORS;  Service: Orthopedics;  Laterality: Right;   POLYPECTOMY  04/24/2023   Procedure: POLYPECTOMY;  Surgeon: Onita Elspeth Sharper, DO;  Location: [facility] ENDOSCOPY;  Service: Gastroenterology;;   TUBAL LIGATION     WISDOM TOOTH EXTRACTION      Allergies  Allergen Reactions   Dust Mite Extract    Pollen Extract    Tree Extract     Objective/Physical Exam Neurovascular status intact.  Incision well coapted with sutures intact. Toe in rectus alignment. No Edema. No tenderness.   Assessment: 1. s/p hammertoe repair second digit right foot. DOS: 05/05/2024   Plan of Care:  -Patient was evaluated.  -Sutures removed -Okay to transition out of the postop shoe into good supportive tennis shoes and sneakers -Return to clinic 4 weeks   Thresa EMERSON Sar, DPM [facility]  Dr. Thresa EMERSON Sar, DPM    [address] [place], [place] [address]                Office [phone]  Fax [phone]

## 2024-05-25 DIAGNOSIS — M216X2 Other acquired deformities of left foot: Secondary | ICD-10-CM | POA: Insufficient documentation

## 2024-05-25 NOTE — Progress Notes (Signed)
   Chief Complaint  Patient presents with   Routine Post Op    POV # 1 DOS 8/14 RT 2ND TO HAMMERTOE ARTHROPLASTY, pt states little to no pain, no complaints.     Subjective:  Patient presents today status post hammertoe arthroplasty second digit right foot.  DOS: 05/05/2024.  Doing well.  WBAT surgical shoe as instructed.  No new complaints  Past Medical History:  Diagnosis Date   Arthritis    Endometrial polyp    GERD (gastroesophageal reflux disease)    Hypertension    Hypothyroidism    Mild depression    Plantar fasciitis    Tendonitis    Tendonitis     Past Surgical History:  Procedure Laterality Date   COLONOSCOPY WITH PROPOFOL  N/A 04/24/2023   Procedure: COLONOSCOPY WITH PROPOFOL ;  Surgeon: Onita Elspeth Sharper, DO;  Location: [facility] ENDOSCOPY;  Service: Gastroenterology;  Laterality: N/A;   ENDOMETRIAL BIOPSY     HYSTEROSCOPY     KNEE ARTHROSCOPY WITH EXCISION PLICA  12/22/2019   Procedure: KNEE ARTHROSCOPY WITH EXCISION PLICA;  Surgeon: Kathlynn Sharper, MD;  Location: [facility] ORS;  Service: Orthopedics;;   KNEE ARTHROSCOPY WITH MEDIAL MENISECTOMY Right 12/22/2019   Procedure: MEDIAL FEMORAL SUBCHONDROPLASTY;  Surgeon: Kathlynn Sharper, MD;  Location: [facility] ORS;  Service: Orthopedics;  Laterality: Right;   POLYPECTOMY  04/24/2023   Procedure: POLYPECTOMY;  Surgeon: Onita Elspeth Sharper, DO;  Location: [facility] ENDOSCOPY;  Service: Gastroenterology;;   TUBAL LIGATION     WISDOM TOOTH EXTRACTION      Allergies  Allergen Reactions   Dust Mite Extract    Pollen Extract    Tree Extract     Objective/Physical Exam Neurovascular status intact.  Incision well coapted with sutures intact. No sign of infectious process noted. No dehiscence. No active bleeding noted.  Mild edema noted to the toe  Radiographic Exam RT foot 05/13/2024:  No hardware noted.  Toe in rectus alignment.  Medial exostectomy of the DIPJ noted  Assessment: 1. s/p exostectomy second digit right. DOS:  05/05/2024   Plan of Care:  -Patient was evaluated. X-rays reviewed -Patient will begin washing and showering and getting the foot wet -Continue WBAT surgical shoe -Return to clinic 1 week suture removal   Thresa EMERSON Sar, DPM [facility]  Dr. Thresa EMERSON Sar, DPM    [address] [place], [place] [address]                Office [phone]  Fax [phone]

## 2024-06-03 ENCOUNTER — Encounter: Admitting: Podiatry

## 2024-06-16 ENCOUNTER — Ambulatory Visit

## 2024-06-16 ENCOUNTER — Ambulatory Visit (INDEPENDENT_AMBULATORY_CARE_PROVIDER_SITE_OTHER)

## 2024-06-16 DIAGNOSIS — M79672 Pain in left foot: Secondary | ICD-10-CM

## 2024-06-16 DIAGNOSIS — Z9889 Other specified postprocedural states: Secondary | ICD-10-CM | POA: Diagnosis not present

## 2024-06-16 DIAGNOSIS — M109 Gout, unspecified: Secondary | ICD-10-CM

## 2024-06-16 MED ORDER — INDOMETHACIN 50 MG PO CAPS: 50.0000 mg | ORAL_CAPSULE | Freq: Three times a day (TID) | ORAL | 0 refills | Status: AC

## 2024-06-16 NOTE — Progress Notes (Signed)
 Chief Complaint  Patient presents with   Foot Pain    went to [facility] and has gout. Has been on prednisone for a week    Subjective:  Patient presents today status post RT second hammertoe correction.  She has transitioned into stable supportive shoe gear without issue.  DOS: 05/05/2024. Feeling well. Separately, she developed significant pain, hypersensitivity to the big toe joint on her left foot which she first noticed approximately 2 weeks ago.  She presented to urgent care where she was diagnosed with gout and was prescribed a short course of tapered oral steroids. She states that it has improved but she does have residual edema and some tenderness. No images were taken.   Past Medical History:  Diagnosis Date   Arthritis    Endometrial polyp    GERD (gastroesophageal reflux disease)    Hypertension    Hypothyroidism    Mild depression    Plantar fasciitis    Tendonitis    Tendonitis     Past Surgical History:  Procedure Laterality Date   COLONOSCOPY WITH PROPOFOL  N/A 04/24/2023   Procedure: COLONOSCOPY WITH PROPOFOL ;  Surgeon: Onita Elspeth Sharper, DO;  Location: [facility] ENDOSCOPY;  Service: Gastroenterology;  Laterality: N/A;   ENDOMETRIAL BIOPSY     HYSTEROSCOPY     KNEE ARTHROSCOPY WITH EXCISION PLICA  12/22/2019   Procedure: KNEE ARTHROSCOPY WITH EXCISION PLICA;  Surgeon: Kathlynn Sharper, MD;  Location: [facility] ORS;  Service: Orthopedics;;   KNEE ARTHROSCOPY WITH MEDIAL MENISECTOMY Right 12/22/2019   Procedure: MEDIAL FEMORAL SUBCHONDROPLASTY;  Surgeon: Kathlynn Sharper, MD;  Location: [facility] ORS;  Service: Orthopedics;  Laterality: Right;   POLYPECTOMY  04/24/2023   Procedure: POLYPECTOMY;  Surgeon: Onita Elspeth Sharper, DO;  Location: [facility] ENDOSCOPY;  Service: Gastroenterology;;   TUBAL LIGATION     WISDOM TOOTH EXTRACTION      Allergies  Allergen Reactions   Dust Mite Extract    Pollen Extract    Tree Extract     Objective/Physical Exam Neurovascular status intact  bilaterally.   Right foot second digit incision well-healed. Toe in rectus alignment. No Edema. No tenderness.  Left foot first metatarsal phalangeal joint exhibits focal edema and mild tenderness to palpation, most prominent medially.  No pain with range of motion.  No erythema, drainage, open wound or other signs of infection.  3 weightbearing x-rays of the left foot were taken today.  There is increased soft tissue density and volume medial to the first metatarsophalangeal joint.  No cortical erosions.  Mild pes planus foot shape.  Joint space narrowing with subchondral sclerosis and osteophyte formation consistent with arthritis of the second tarsometatarsal joint.  Acute osseous pathology such as fractures, dislocation, signs of infection are not identified.  Assessment: 1. s/p hammertoe repair second digit right foot. DOS: 05/05/2024 2.  Left first metatarsophalangeal joint gout   Plan of Care:  -Patient was evaluated.  - Right foot doing well status post digital surgery without any complication.  - Left foot first metatarsophalangeal joint gout is significantly improved compared to initial presentation to the urgent care.  We did discuss lifestyle changes, diet, possible medication management that may be needed in the long run.  She would like to attempt lifestyle changes first as she does not like to take medication if she can avoid it. - I did discuss with her that I would recommend indomethacin  50 mg 3 times daily while the left first metatarsophalangeal joint continues to transition out of acute gouty flare.  Once it is no longer tender, she does not need to continue taking it.  I prescribed a 30-day supply of this.  If she does feel another gout attack coming on, she can begin to take this.   Prentice Ovens, DPM [facility]

## 2024-06-24 ENCOUNTER — Encounter: Admitting: Podiatry

## 2024-06-28 ENCOUNTER — Encounter: Payer: Self-pay | Admitting: Podiatry

## 2024-06-28 ENCOUNTER — Ambulatory Visit: Admitting: Podiatry

## 2024-06-28 VITALS — Wt 268.8 lb

## 2024-06-28 DIAGNOSIS — M2041 Other hammer toe(s) (acquired), right foot: Secondary | ICD-10-CM

## 2024-06-28 NOTE — Progress Notes (Signed)
   Chief Complaint  Patient presents with   Routine Post Op     POV #3 DOS 8/14 RT 2ND TO HAMMERTOE ARTHROPLASTY, pt is here to f/u on right foot she states everything is going well has no pain, back to wearing normal shoes.     Subjective:  Patient presents today status post RT second hammertoe correction. DOS: 05/05/2024.  Doing very well.  She says that the toe is feeling well but she does have some slight paresthesia and numbness along the lateral column of the right foot  Past Medical History:  Diagnosis Date   Arthritis    Endometrial polyp    GERD (gastroesophageal reflux disease)    Hypertension    Hypothyroidism    Mild depression    Plantar fasciitis    Tendonitis    Tendonitis     Past Surgical History:  Procedure Laterality Date   COLONOSCOPY WITH PROPOFOL  N/A 04/24/2023   Procedure: COLONOSCOPY WITH PROPOFOL ;  Surgeon: Onita Elspeth Sharper, DO;  Location: [facility] ENDOSCOPY;  Service: Gastroenterology;  Laterality: N/A;   ENDOMETRIAL BIOPSY     HYSTEROSCOPY     KNEE ARTHROSCOPY WITH EXCISION PLICA  12/22/2019   Procedure: KNEE ARTHROSCOPY WITH EXCISION PLICA;  Surgeon: Kathlynn Sharper, MD;  Location: [facility] ORS;  Service: Orthopedics;;   KNEE ARTHROSCOPY WITH MEDIAL MENISECTOMY Right 12/22/2019   Procedure: MEDIAL FEMORAL SUBCHONDROPLASTY;  Surgeon: Kathlynn Sharper, MD;  Location: [facility] ORS;  Service: Orthopedics;  Laterality: Right;   POLYPECTOMY  04/24/2023   Procedure: POLYPECTOMY;  Surgeon: Onita Elspeth Sharper, DO;  Location: [facility] ENDOSCOPY;  Service: Gastroenterology;;   TUBAL LIGATION     WISDOM TOOTH EXTRACTION      Allergies  Allergen Reactions   Dust Mite Extract    Pollen Extract    Tree Extract     Objective/Physical Exam Neurovascular status intact.  Incision is nicely healed.  Toes in rectus alignment.  No edema or tenderness to the toe.  She does have some slight paresthesia to the lateral aspect of the right foot.  Assessment: 1. s/p hammertoe repair  second digit right foot. DOS: 05/05/2024   Plan of Care:  -Patient was evaluated.  - Okay to resume full activity no restrictions -Recommend good supportive tennis shoes and sneakers -Return to clinic PRN   Thresa EMERSON Sar, DPM [facility]  Dr. Thresa EMERSON Sar, DPM    [address] [place], [place] [address]                Office [phone]  Fax [phone]

## 2024-09-23 ENCOUNTER — Emergency Department

## 2024-09-23 ENCOUNTER — Other Ambulatory Visit: Payer: Self-pay

## 2024-09-23 ENCOUNTER — Emergency Department: Admission: EM | Admit: 2024-09-23 | Discharge: 2024-09-23 | Disposition: A | Discharge status: Home / Self Care

## 2024-09-23 DIAGNOSIS — N939 Abnormal uterine and vaginal bleeding, unspecified: Secondary | ICD-10-CM | POA: Insufficient documentation

## 2024-09-23 DIAGNOSIS — I10 Essential (primary) hypertension: Secondary | ICD-10-CM | POA: Diagnosis not present

## 2024-09-23 DIAGNOSIS — R9389 Abnormal findings on diagnostic imaging of other specified body structures: Secondary | ICD-10-CM | POA: Diagnosis not present

## 2024-09-23 LAB — CBC WITH DIFFERENTIAL/PLATELET
Abs Immature Granulocytes: 0.01 K/uL (ref 0.00–0.07)
Basophils Absolute: 0 K/uL (ref 0.0–0.1)
Basophils Relative: 0 %
Eosinophils Absolute: 0.1 K/uL (ref 0.0–0.5)
Eosinophils Relative: 2 %
HCT: 42.1 % (ref 36.0–46.0)
Hemoglobin: 13.7 g/dL (ref 12.0–15.0)
Immature Granulocytes: 0 %
Lymphocytes Relative: 25 %
Lymphs Abs: 1.2 K/uL (ref 0.7–4.0)
MCH: 30.4 pg (ref 26.0–34.0)
MCHC: 32.5 g/dL (ref 30.0–36.0)
MCV: 93.3 fL (ref 80.0–100.0)
Monocytes Absolute: 0.4 K/uL (ref 0.1–1.0)
Monocytes Relative: 8 %
Neutro Abs: 3 K/uL (ref 1.7–7.7)
Neutrophils Relative %: 65 %
Platelets: 182 K/uL (ref 150–400)
RBC: 4.51 MIL/uL (ref 3.87–5.11)
RDW: 13.1 % (ref 11.5–15.5)
WBC: 4.6 K/uL (ref 4.0–10.5)
nRBC: 0 % (ref 0.0–0.2)

## 2024-09-23 LAB — COMPREHENSIVE METABOLIC PANEL WITH GFR
ALT: 14 U/L (ref 0–44)
AST: 22 U/L (ref 15–41)
Albumin: 4 g/dL (ref 3.5–5.0)
Alkaline Phosphatase: 77 U/L (ref 38–126)
Anion gap: 10 (ref 5–15)
BUN: 14 mg/dL (ref 8–23)
CO2: 27 mmol/L (ref 22–32)
Calcium: 9.3 mg/dL (ref 8.9–10.3)
Chloride: 105 mmol/L (ref 98–111)
Creatinine, Ser: 0.71 mg/dL (ref 0.44–1.00)
GFR, Estimated: >60 mL/min
Glucose, Bld: 88 mg/dL (ref 70–99)
Potassium: 3.7 mmol/L (ref 3.5–5.1)
Sodium: 142 mmol/L (ref 135–145)
Total Bilirubin: 0.4 mg/dL (ref 0.0–1.2)
Total Protein: 7.9 g/dL (ref 6.5–8.1)

## 2024-09-23 NOTE — ED Triage Notes (Signed)
 First nurse note: Vaginal bleeding since 6 am, heavy bleeding , no cycle in 5 years, denies abdominal and pelvic pain Vitals: 150/91, HR 74, 96% RA, 98.29F, 45ft6in, 262.6 lbs

## 2024-09-23 NOTE — Discharge Instructions (Signed)
 Your evaluation in the emergency department is overall reassuring, your blood level was normal.  As discussed, you did have an abnormal pelvic ultrasound, and it was very important that you follow-up with OB/GYN for ongoing evaluation of this.  Return to the emergency department with any new or worsening symptoms.

## 2024-09-23 NOTE — ED Triage Notes (Signed)
 Pt to ED for c/o vaginal bleeding that began this morning. Pt states heavy bleeding, no clots. Denies pain or cramping.

## 2024-09-23 NOTE — ED Provider Notes (Signed)
 "  [facility] Provider Note    Event Date/Time   First MD Initiated Contact with Patient 09/23/24 1734     (approximate)   History   Vaginal Bleeding  First nurse note: Vaginal bleeding since 6 am, heavy bleeding , no cycle in 5 years, denies abdominal and pelvic pain Vitals: 150/91, HR 74, 96% RA, 98.37F, 50ft6in, 262.6 lbs  Pt to ED for c/o vaginal bleeding that began this morning. Pt states heavy bleeding, no clots. Denies pain or cramping.   HPI Brittany Rocha is a 69 y.o. female PMH hypertension, GERD presents for evaluation of vaginal bleeding - Patient states she woke up noticing blood on her bed this morning. She was having some vaginal bleeding.  Has resolved by time of my eval but have been ongoing throughout the day. -Not on any blood thinners -No lightheadedness, shortness of breath, chest pain -No rectal bleeding or bleeding elsewhere -No abdominal/pubic discomfort -No recent sexual intercourse  - No preceding trauma -No prior hysterectomy     Physical Exam   Triage Vital Signs: ED Triage Vitals  Encounter Vitals Group     BP 09/23/24 1545 (!) 165/82     Girls Systolic BP Percentile --      Girls Diastolic BP Percentile --      Boys Systolic BP Percentile --      Boys Diastolic BP Percentile --      Pulse Rate 09/23/24 1545 81     Resp 09/23/24 1545 16     Temp 09/23/24 1545 97.8 F (36.6 C)     Temp Source 09/23/24 1545 Oral     SpO2 09/23/24 1545 97 %     Weight 09/23/24 1547 263 lb (119.3 kg)     Height 09/23/24 1547 5' 6 (1.676 m)     Head Circumference --      Peak Flow --      Pain Score 09/23/24 1546 0     Pain Loc --      Pain Education --      Exclude from Growth Chart --     Most recent vital signs: Vitals:   09/23/24 1545  BP: (!) 165/82  Pulse: 81  Resp: 16  Temp: 97.8 F (36.6 C)  SpO2: 97%     General: Awake, no distress.  CV:  Good peripheral perfusion. RRR, RP 2+ Resp:  Normal effort.  CTAB Abd:  No distention. Nontender to deep palpation throughout Pelvic:  Deferred on initial exam, see later exam below   ED Results / Procedures / Treatments   Labs (all labs ordered are listed, but only abnormal results are displayed) Labs Reviewed  CBC WITH DIFFERENTIAL/PLATELET  COMPREHENSIVE METABOLIC PANEL WITH GFR  URINALYSIS, ROUTINE W REFLEX MICROSCOPIC     EKG  N/a   RADIOLOGY Radiology interpreted by myself and radiology report reviewed.  Abnormal endometrial thickening concerning for possible underlying malignancy.    PROCEDURES:  Critical Care performed: No  Procedures   MEDICATIONS ORDERED IN ED: Medications - No data to display   IMPRESSION / MDM / ASSESSMENT AND PLAN / ED COURSE  I reviewed the triage vital signs and the nursing notes.                              DDX/MDM/AP: Differential diagnosis includes, but is not limited to, abnormal uterine bleeding, consider underlying uterine fibroid or uterine or cervical malignancy, doubt vaginal tear.  Doubt underlying anemia.  Plan: - Labs - Pelvic exam - Pelvic ultrasound  Patient's presentation is most consistent with acute presentation with potential threat to life or bodily function.   ED course below.  Labs unremarkable.  Fortunately no recurrent bleeding here in emergency department.  Chaperoned pelvic exam with trace blood from cervix, no vaginal masses appreciated.  Pelvic ultrasound with thickened endometrium concerning for possible underlying malignancy per radiology.  Patient notified of findings and emphasized importance of close OB/GYN follow-up for ongoing evaluation of this.  Provided contact information for on-call OB to schedule an outpatient appointment.  Also recommend PMD follow-up in addition.  Strict ED return precautions in place.  Patient agrees with plan.  Clinical Course as of 09/23/24 2011  Fri Sep 23, 2024  1750 CBC, CMP reviewed, unremarkable [MM]  1944  US : IMPRESSION: 1. Thickened, heterogeneous, cystic and solid-appearing endometrium measuring up to 15 mm; recommend gynecologic evaluation and endometrial sampling. Concern for malignancy. 2. Bilateral ovaries not visualized due to overlying bowel gas.   [MM]  2006 Patient updated on ultrasound findings including possibility of underlying malignancy.  Understands importance of following up with OB/GYN for potential further testing and treatment.  Remains with no recurrent bleeding, hemodynamically stable and very well-appearing here.  Will proceed with discharge home and close outpatient follow-up. [MM]    Clinical Course User Index [MM] Clarine Ozell LABOR, MD     FINAL CLINICAL IMPRESSION(S) / ED DIAGNOSES   Final diagnoses:  Abnormal uterine bleeding  Abnormal pelvic ultrasound     Rx / DC Orders   ED Discharge Orders     None        Note:  This document was prepared using Dragon voice recognition software and may include unintentional dictation errors.   Clarine Ozell LABOR, MD 09/23/24 2011  "
